# Patient Record
Sex: Male | Born: 1960 | Race: White | Hispanic: No | Smoking: Never smoker
Health system: Southern US, Community
[De-identification: ages and names within clinical notes are randomized; demographics above are authoritative.]

## PROBLEM LIST (undated history)

## (undated) DIAGNOSIS — R0602 Shortness of breath: Secondary | ICD-10-CM

## (undated) DIAGNOSIS — F32A Depression, unspecified: Secondary | ICD-10-CM

## (undated) DIAGNOSIS — R519 Headache, unspecified: Secondary | ICD-10-CM

## (undated) DIAGNOSIS — J302 Other seasonal allergic rhinitis: Secondary | ICD-10-CM

## (undated) DIAGNOSIS — Z87442 Personal history of urinary calculi: Secondary | ICD-10-CM

## (undated) DIAGNOSIS — J45909 Unspecified asthma, uncomplicated: Secondary | ICD-10-CM

## (undated) DIAGNOSIS — M545 Low back pain, unspecified: Secondary | ICD-10-CM

## (undated) DIAGNOSIS — K219 Gastro-esophageal reflux disease without esophagitis: Secondary | ICD-10-CM

## (undated) DIAGNOSIS — F439 Reaction to severe stress, unspecified: Secondary | ICD-10-CM

## (undated) DIAGNOSIS — I4430 Unspecified atrioventricular block: Secondary | ICD-10-CM

## (undated) DIAGNOSIS — G8929 Other chronic pain: Secondary | ICD-10-CM

## (undated) DIAGNOSIS — Z950 Presence of cardiac pacemaker: Secondary | ICD-10-CM

## (undated) HISTORY — DX: Shortness of breath: R06.02

## (undated) HISTORY — PX: NO PAST SURGERIES: SHX2092

---

## 2012-10-28 DIAGNOSIS — I447 Left bundle-branch block, unspecified: Secondary | ICD-10-CM

## 2012-10-28 HISTORY — DX: Left bundle-branch block, unspecified: I44.7

## 2012-10-29 DIAGNOSIS — E785 Hyperlipidemia, unspecified: Secondary | ICD-10-CM | POA: Insufficient documentation

## 2012-10-29 HISTORY — DX: Hyperlipidemia, unspecified: E78.5

## 2012-12-16 LAB — HM COLONOSCOPY

## 2014-01-22 DIAGNOSIS — F439 Reaction to severe stress, unspecified: Secondary | ICD-10-CM | POA: Insufficient documentation

## 2014-01-22 DIAGNOSIS — E669 Obesity, unspecified: Secondary | ICD-10-CM

## 2014-01-22 HISTORY — DX: Obesity, unspecified: E66.9

## 2016-12-21 DIAGNOSIS — M5116 Intervertebral disc disorders with radiculopathy, lumbar region: Secondary | ICD-10-CM

## 2016-12-21 HISTORY — DX: Intervertebral disc disorders with radiculopathy, lumbar region: M51.16

## 2018-03-02 ENCOUNTER — Emergency Department: Payer: PRIVATE HEALTH INSURANCE

## 2018-03-02 ENCOUNTER — Encounter: Payer: Self-pay | Admitting: Emergency Medicine

## 2018-03-02 ENCOUNTER — Emergency Department
Admission: EM | Admit: 2018-03-02 | Discharge: 2018-03-02 | Disposition: A | Discharge status: Home / Self Care | Payer: PRIVATE HEALTH INSURANCE | Attending: Emergency Medicine | Admitting: Emergency Medicine

## 2018-03-02 DIAGNOSIS — S161XXA Strain of muscle, fascia and tendon at neck level, initial encounter: Secondary | ICD-10-CM | POA: Diagnosis not present

## 2018-03-02 DIAGNOSIS — Y9389 Activity, other specified: Secondary | ICD-10-CM | POA: Diagnosis not present

## 2018-03-02 DIAGNOSIS — Y92410 Unspecified street and highway as the place of occurrence of the external cause: Secondary | ICD-10-CM | POA: Diagnosis not present

## 2018-03-02 DIAGNOSIS — Y999 Unspecified external cause status: Secondary | ICD-10-CM | POA: Insufficient documentation

## 2018-03-02 DIAGNOSIS — J45909 Unspecified asthma, uncomplicated: Secondary | ICD-10-CM | POA: Diagnosis not present

## 2018-03-02 DIAGNOSIS — S199XXA Unspecified injury of neck, initial encounter: Secondary | ICD-10-CM | POA: Diagnosis present

## 2018-03-02 HISTORY — DX: Unspecified asthma, uncomplicated: J45.909

## 2018-03-02 NOTE — ED Notes (Signed)
ED Provider at bedside. 
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
## 2018-08-14 ENCOUNTER — Inpatient Hospital Stay (HOSPITAL_COMMUNITY)
Admission: EM | Admit: 2018-08-14 | Discharge: 2018-08-16 | DRG: 243 | Disposition: A | Discharge status: Home / Self Care | Payer: PRIVATE HEALTH INSURANCE | Attending: Cardiovascular Disease | Admitting: Cardiovascular Disease

## 2018-08-14 ENCOUNTER — Other Ambulatory Visit: Payer: Self-pay

## 2018-08-14 ENCOUNTER — Encounter (HOSPITAL_COMMUNITY): Payer: Self-pay

## 2018-08-14 ENCOUNTER — Emergency Department (HOSPITAL_COMMUNITY): Payer: PRIVATE HEALTH INSURANCE

## 2018-08-14 DIAGNOSIS — Z79899 Other long term (current) drug therapy: Secondary | ICD-10-CM | POA: Diagnosis not present

## 2018-08-14 DIAGNOSIS — Z91013 Allergy to seafood: Secondary | ICD-10-CM | POA: Diagnosis not present

## 2018-08-14 DIAGNOSIS — I200 Unstable angina: Secondary | ICD-10-CM | POA: Diagnosis present

## 2018-08-14 DIAGNOSIS — R9431 Abnormal electrocardiogram [ECG] [EKG]: Secondary | ICD-10-CM

## 2018-08-14 DIAGNOSIS — Z87442 Personal history of urinary calculi: Secondary | ICD-10-CM

## 2018-08-14 DIAGNOSIS — J45909 Unspecified asthma, uncomplicated: Secondary | ICD-10-CM | POA: Diagnosis present

## 2018-08-14 DIAGNOSIS — K219 Gastro-esophageal reflux disease without esophagitis: Secondary | ICD-10-CM | POA: Diagnosis present

## 2018-08-14 DIAGNOSIS — I441 Atrioventricular block, second degree: Secondary | ICD-10-CM | POA: Diagnosis present

## 2018-08-14 DIAGNOSIS — I442 Atrioventricular block, complete: Secondary | ICD-10-CM | POA: Diagnosis not present

## 2018-08-14 DIAGNOSIS — Z959 Presence of cardiac and vascular implant and graft, unspecified: Secondary | ICD-10-CM

## 2018-08-14 DIAGNOSIS — Z7982 Long term (current) use of aspirin: Secondary | ICD-10-CM | POA: Diagnosis not present

## 2018-08-14 DIAGNOSIS — I4430 Unspecified atrioventricular block: Secondary | ICD-10-CM | POA: Diagnosis present

## 2018-08-14 DIAGNOSIS — I10 Essential (primary) hypertension: Secondary | ICD-10-CM | POA: Diagnosis present

## 2018-08-14 DIAGNOSIS — I459 Conduction disorder, unspecified: Secondary | ICD-10-CM

## 2018-08-14 DIAGNOSIS — R0602 Shortness of breath: Secondary | ICD-10-CM

## 2018-08-14 HISTORY — DX: Low back pain, unspecified: M54.50

## 2018-08-14 HISTORY — DX: Low back pain: M54.5

## 2018-08-14 HISTORY — DX: Unspecified atrioventricular block: I44.30

## 2018-08-14 HISTORY — DX: Gastro-esophageal reflux disease without esophagitis: K21.9

## 2018-08-14 HISTORY — DX: Other chronic pain: G89.29

## 2018-08-14 HISTORY — DX: Reaction to severe stress, unspecified: F43.9

## 2018-08-14 HISTORY — DX: Personal history of urinary calculi: Z87.442

## 2018-08-14 HISTORY — DX: Abnormal electrocardiogram (ECG) (EKG): R94.31

## 2018-08-14 LAB — CBC
HEMATOCRIT: 43.3 % (ref 39.0–52.0)
HEMATOCRIT: 42.1 % (ref 39.0–52.0)
HEMOGLOBIN: 13.6 g/dL (ref 13.0–17.0)
Hemoglobin: 14 g/dL (ref 13.0–17.0)
MCH: 28.1 pg (ref 26.0–34.0)
MCH: 27.9 pg (ref 26.0–34.0)
MCHC: 32.3 g/dL (ref 30.0–36.0)
MCHC: 32.3 g/dL (ref 30.0–36.0)
MCV: 86.9 fL (ref 80.0–100.0)
MCV: 86.3 fL (ref 80.0–100.0)
NRBC: 0 % (ref 0.0–0.2)
Platelets: 259 10*3/uL (ref 150–400)
Platelets: 228 10*3/uL (ref 150–400)
RBC: 4.98 MIL/uL (ref 4.22–5.81)
RBC: 4.88 MIL/uL (ref 4.22–5.81)
RDW: 12.6 % (ref 11.5–15.5)
RDW: 12.8 % (ref 11.5–15.5)
WBC: 9.5 10*3/uL (ref 4.0–10.5)
WBC: 9.3 10*3/uL (ref 4.0–10.5)
nRBC: 0 % (ref 0.0–0.2)

## 2018-08-14 LAB — BRAIN NATRIURETIC PEPTIDE: B Natriuretic Peptide: 59.4 pg/mL (ref 0.0–100.0)

## 2018-08-14 LAB — BASIC METABOLIC PANEL
ANION GAP: 9 (ref 5–15)
BUN: 16 mg/dL (ref 6–20)
CHLORIDE: 106 mmol/L (ref 98–111)
CO2: 25 mmol/L (ref 22–32)
Calcium: 9.5 mg/dL (ref 8.9–10.3)
Creatinine, Ser: 1.45 mg/dL — ABNORMAL HIGH (ref 0.61–1.24)
GFR calc Af Amer: >60 mL/min (ref >60)
GFR, EST NON AFRICAN AMERICAN: 52 mL/min — AB (ref >60)
GLUCOSE: 95 mg/dL (ref 70–99)
POTASSIUM: 4.6 mmol/L (ref 3.5–5.1)
SODIUM: 140 mmol/L (ref 135–145)

## 2018-08-14 LAB — I-STAT TROPONIN, ED: TROPONIN I, POC: 0.01 ng/mL (ref 0.00–0.08)

## 2018-08-14 MED ORDER — SODIUM CHLORIDE 0.9 % IV SOLN: 250.0000 mL | INTRAVENOUS | Status: DC | PRN

## 2018-08-14 MED ORDER — ASPIRIN EC 81 MG PO TBEC
81.0000 mg | DELAYED_RELEASE_TABLET | Freq: Every day | ORAL | Status: DC
  Administered 2018-08-15: 08:00:00 81 mg via ORAL
  Filled 2018-08-14: qty 1

## 2018-08-14 MED ORDER — ACETAMINOPHEN 325 MG PO TABS: 650.0000 mg | ORAL_TABLET | ORAL | Status: DC | PRN

## 2018-08-14 MED ORDER — SODIUM CHLORIDE 0.9% FLUSH
3.0000 mL | Freq: Two times a day (BID) | INTRAVENOUS | Status: DC
  Administered 2018-08-14 – 2018-08-15 (×2): 3 mL via INTRAVENOUS

## 2018-08-14 MED ORDER — ONDANSETRON HCL 4 MG/2ML IJ SOLN: 4.0000 mg | Freq: Four times a day (QID) | INTRAMUSCULAR | Status: DC | PRN

## 2018-08-14 MED ORDER — ASPIRIN 81 MG PO CHEW
81.0000 mg | CHEWABLE_TABLET | ORAL | Status: AC
  Administered 2018-08-15: 06:00:00 81 mg via ORAL
  Filled 2018-08-14: qty 1

## 2018-08-14 MED ORDER — ASPIRIN 81 MG PO CHEW
324.0000 mg | CHEWABLE_TABLET | Freq: Once | ORAL | Status: AC
  Administered 2018-08-14: 324 mg via ORAL
  Filled 2018-08-14: qty 4

## 2018-08-14 MED ORDER — SODIUM CHLORIDE 0.9 % WEIGHT BASED INFUSION
3.0000 mL/kg/h | INTRAVENOUS | Status: DC
  Administered 2018-08-15: 04:00:00 3 mL/kg/h via INTRAVENOUS

## 2018-08-14 MED ORDER — SODIUM CHLORIDE 0.9 % WEIGHT BASED INFUSION: 1.0000 mL/kg/h | INTRAVENOUS | Status: DC

## 2018-08-14 MED ORDER — SODIUM CHLORIDE 0.9 % IV SOLN
INTRAVENOUS | Status: DC
  Administered 2018-08-14 – 2018-08-15 (×2): via INTRAVENOUS

## 2018-08-14 MED ORDER — SODIUM CHLORIDE 0.9% FLUSH: 3.0000 mL | INTRAVENOUS | Status: DC | PRN

## 2018-08-14 MED ORDER — HEPARIN SODIUM (PORCINE) 5000 UNIT/ML IJ SOLN
5000.0000 [IU] | Freq: Three times a day (TID) | INTRAMUSCULAR | Status: DC
  Administered 2018-08-14 – 2018-08-15 (×2): 5000 [IU] via SUBCUTANEOUS
  Filled 2018-08-14 (×2): qty 1

## 2018-08-14 NOTE — ED Notes (Signed)
Pt ambulated to the restroom without difficulty. Gait steady and even. Denies feeling lightheaded/dizzy.

## 2018-08-14 NOTE — ED Provider Notes (Signed)
[facility] EMERGENCY DEPARTMENT Provider Note   CSN: [id number] Arrival date & time: 08/14/18  1638     History   Chief Complaint Chief Complaint  Patient presents with  . Dizziness  . Bradycardia    HPI Brandon Ramirez is a 57 y.o. male.  HPI Pt has been having episodes of lightheadedness.  One occurred while skating with his son but the symptoms resolved.  These have been going on and off, no syncope.  Not always with exertion.   He has been trouble with intermittent shortness of breath with exertion as well although  not consistently.  He was able to bike ride as well as doing some heavy activity in the yard without trouble.   Pt has a trip planned for tomorrow.  He went to an urgent care for evaluation prior to the trip and they sent him to the ED for evaluation.  He was told he had a heart block and was instructed to come to the ED Past Medical History:  Diagnosis Date  . Asthma     Patient Active Problem List   Diagnosis Date Noted  . Abnormal EKG 08/14/2018    History reviewed. No pertinent surgical history.      Home Medications    Prior to Admission medications   Not on File    Family History History reviewed. No pertinent family history.  Social History Social History   Tobacco Use  . Smoking status: Never Smoker  . Smokeless tobacco: Never Used  Substance Use Topics  . Alcohol use: Not on file  . Drug use: Not on file     Allergies   Patient has no known allergies.   Review of Systems Review of Systems  Constitutional: Negative for fever.  Respiratory: Negative for shortness of breath.   Cardiovascular: Negative for chest pain.  All other systems reviewed and are negative.    Physical Exam Updated Vital Signs BP (!) 141/82   Pulse 63   Resp 11   SpO2 98%   Physical Exam  Constitutional: He appears well-developed and well-nourished. No distress.  HENT:  Head: Normocephalic and atraumatic.  Right Ear:  External ear normal.  Left Ear: External ear normal.  Eyes: Conjunctivae are normal. Right eye exhibits no discharge. Left eye exhibits no discharge. No scleral icterus.  Neck: Neck supple. No tracheal deviation present.  Cardiovascular: Intact distal pulses. An irregular rhythm present. Bradycardia present.  Pulmonary/Chest: Effort normal and breath sounds normal. No stridor. No respiratory distress. He has no wheezes. He has no rales.  Abdominal: Soft. Bowel sounds are normal. He exhibits no distension. There is no tenderness. There is no rebound and no guarding.  Musculoskeletal: He exhibits no edema or tenderness.  Neurological: He is alert. He has normal strength. No cranial nerve deficit (no facial droop, extraocular movements intact, no slurred speech) or sensory deficit. He exhibits normal muscle tone. He displays no seizure activity. Coordination normal.  Skin: Skin is warm and dry. No rash noted.  Psychiatric: He has a normal mood and affect.  Nursing note and vitals reviewed.    ED Treatments / Results  Labs (all labs ordered are listed, but only abnormal results are displayed) Labs Reviewed  CBC  BASIC METABOLIC PANEL  BRAIN NATRIURETIC PEPTIDE  I-STAT TROPONIN, ED    EKG EKG Interpretation  Date/Time:  Thursday August 14 2018 16:57:47 EDT Ventricular Rate:  40 PR Interval:    QRS Duration: 124 QT Interval:  446 QTC Calculation:  345 R Axis:   87 Text Interpretation:  Sinus rhythm with second degree AV block IVCD, consider atypical RBBB No old tracing to compare Confirmed by Linwood Dibbles [phone]) on 08/14/2018 5:05:56 PM   Radiology Dg Chest Portable 1 View  Result Date: 08/14/2018 CLINICAL DATA:  Shortness of breath and bradycardia. EXAM: PORTABLE CHEST 1 VIEW COMPARISON:  None. FINDINGS: Artifact overlies the chest. Heart and mediastinal shadows are normal. Lungs are clear. The vascularity is normal. No effusions. IMPRESSION: No active disease. Electronically  Signed   By: Paulina Fusi M.D.   On: 08/14/2018 17:36    Procedures .Critical Care Performed by: Linwood Dibbles, MD Authorized by: Linwood Dibbles, MD   Critical care provider statement:    Critical care time (minutes):  30   Critical care was time spent personally by me on the following activities:  Discussions with consultants, evaluation of patient's response to treatment, examination of patient, ordering and performing treatments and interventions, ordering and review of laboratory studies, ordering and review of radiographic studies, pulse oximetry, re-evaluation of patient's condition, obtaining history from patient or surrogate and review of old charts   (including critical care time)  Medications Ordered in ED Medications  0.9 %  sodium chloride infusion ( Intravenous New Bag/Given 08/14/18 1718)  aspirin chewable tablet 324 mg (324 mg Oral Given 08/14/18 1718)     Initial Impression / Assessment and Plan / ED Course  I have reviewed the triage vital signs and the nursing notes.  Pertinent labs & imaging results that were available during my care of the patient were reviewed by me and considered in my medical decision making (see chart for details).  Clinical Course as of Aug 15 1835  Thu Aug 14, 2018  1710 EKG at the urgent care uploaded to media file   [JK]    Clinical Course User Index [JK] Linwood Dibbles, MD    Patient presented to the emergency room for further evaluation of newly diagnosed heart block.  Patient has been symptomatic.  He has had episodes of lightheadedness and decreased exercise tolerance.  I consulted with cardiology.  Dr. Elease Hashimoto evaluated the patient in the ED.  Patient is currently stable at this point but plan is admission to the hospital for probable pacemaker placement and further evaluation  Final Clinical Impressions(s) / ED Diagnoses   Final diagnoses:  Heart block atrioventricular      Linwood Dibbles, MD 08/14/18 [phone]

## 2018-08-14 NOTE — ED Triage Notes (Signed)
Pt arrived via [facility] from urgent care c/o SOB and dizziness over the last couple weeks.  EMS reports type 2 2nd block per their EKG.  Heart rate with them fluctuates between 40-60bpm.  Denies and chest pain, no meds given.

## 2018-08-14 NOTE — H&P (Addendum)
Cardiology History and Physical:   Patient ID: Brandon Ramirez MRN: [id number]; DOB: [birth year]  Admit date: 08/14/2018 Date of Consult: 08/14/2018  Primary Care Provider: Patient, No Pcp Per Primary Cardiologist: Kristeen Miss, MD  Primary Electrophysiologist:  None    Patient Profile:   Brandon Ramirez is a 57 y.o. male with a hx of asthma who is being seen today for the evaluation of abnormal EKG at the request of Dr. Lynelle Doctor.  History of Present Illness:   Brandon Ramirez has been experiencing intermittent SOB and dizziness over the past 2.5 weeks. He is planning to go on vacation and decided to go to urgent care to be evaluated. At UC, EKG was obtained and was suspicious for possible heart block. He was advised to present to the ER. On arrival, pressure was 180/152. EKG with possible 2:1 heart block. Cardiology was consulted.  On my interview, 2.5 weeks ago while ice skating with son, felt weak. He rested and felt better.  Since then, he had SOB and weakness with minimal walking (20 ft or walking up stairs). Commutes to [place] for work. He is active, biking, exercise, etc. He denies CP. Denies tick bites. He takes no medications and no drug allergies. No bleeding problems, fevers, or recent illness.  In ER, pressure is mildly elevated 154/93. He is normally 120s systolic.   Family history - Dad had a PPM (49 yo), Mom PPM (90s).    Past Medical History:  Diagnosis Date  . Asthma     History reviewed. No pertinent surgical history.   Home Medications:  Prior to Admission medications   Not on File    Inpatient Medications: Scheduled Meds:  Continuous Infusions: . sodium chloride 125 mL/hr at 08/14/18 1718   PRN Meds:   Allergies:   No Known Allergies  Social History:   Social History   Socioeconomic History  . Marital status: Single    Spouse name: Not on file  . Number of children: Not on file  . Years of education: Not on file  . Highest education level: Not on  file  Occupational History  . Not on file  Social Needs  . Financial resource strain: Not on file  . Food insecurity:    Worry: Not on file    Inability: Not on file  . Transportation needs:    Medical: Not on file    Non-medical: Not on file  Tobacco Use  . Smoking status: Never Smoker  . Smokeless tobacco: Never Used  Substance and Sexual Activity  . Alcohol use: Not on file  . Drug use: Not on file  . Sexual activity: Not on file  Lifestyle  . Physical activity:    Days per week: Not on file    Minutes per session: Not on file  . Stress: Not on file  Relationships  . Social connections:    Talks on phone: Not on file    Gets together: Not on file    Attends religious service: Not on file    Active member of club or organization: Not on file    Attends meetings of clubs or organizations: Not on file    Relationship status: Not on file  . Intimate partner violence:    Fear of current or ex partner: Not on file    Emotionally abused: Not on file    Physically abused: Not on file    Forced sexual activity: Not on file  Other Topics Concern  . Not on file  Social History Narrative  . Not on file    Family History:   History reviewed. No pertinent family history.   ROS:  Please see the history of present illness.   All other ROS reviewed and negative.     Physical Exam/Data:   Vitals:   08/14/18 1644 08/14/18 1650 08/14/18 1700 08/14/18 1715  BP:  (!) 180/152 128/71 (!) 141/82  Pulse:  62 69 63  Resp:  18 19 11   SpO2: 97% 100% 98% 98%   No intake or output data in the 24 hours ending 08/14/18 1830 There were no vitals filed for this visit. There is no height or weight on file to calculate BMI.  General:  Well nourished, well developed, in no acute distress HEENT: normal Neck: no JVD Vascular: No carotid bruits Cardiac:  Irregular rhythm, regular rate, no murmur Lungs:  clear to auscultation bilaterally, no wheezing, rhonchi or rales  Abd: soft,  nontender, no hepatomegaly  Ext: no edema Musculoskeletal:  No deformities, BUE and BLE strength normal and equal Skin: warm and dry  Neuro:  CNs 2-12 intact, no focal abnormalities noted Psych:  Normal affect   EKG:  The EKG was personally reviewed and demonstrates:  Intermittent 2:1 heart block Telemetry:  Telemetry was personally reviewed and demonstrates:  2:1 heart block, intermittent sinus  Relevant CV Studies:  Echo pending  Laboratory Data:  ChemistryNo results for input(s): NA, K, CL, CO2, GLUCOSE, BUN, CREATININE, CALCIUM, GFRNONAA, GFRAA, ANIONGAP in the last 168 hours.  No results for input(s): PROT, ALBUMIN, AST, ALT, ALKPHOS, BILITOT in the last 168 hours. Hematology Recent Labs  Lab 08/14/18 1654  WBC 9.5  RBC 4.98  HGB 14.0  HCT 43.3  MCV 86.9  MCH 28.1  MCHC 32.3  RDW 12.6  PLT 259   Cardiac EnzymesNo results for input(s): TROPONINI in the last 168 hours.  Recent Labs  Lab 08/14/18 1734  TROPIPOC 0.01    BNPNo results for input(s): BNP, PROBNP in the last 168 hours.  DDimer No results for input(s): DDIMER in the last 168 hours.  Radiology/Studies:  Dg Chest Portable 1 View  Result Date: 08/14/2018 CLINICAL DATA:  Shortness of breath and bradycardia. EXAM: PORTABLE CHEST 1 VIEW COMPARISON:  None. FINDINGS: Artifact overlies the chest. Heart and mediastinal shadows are normal. Lungs are clear. The vascularity is normal. No effusions. IMPRESSION: No active disease. Electronically Signed   By: Paulina Fusi M.D.   On: 08/14/2018 17:36    Assessment and Plan:   1. Abnormal EKG - step-test performed in room - heart rate increased, but continued to have 2:1 heart block - consulted with EP - plan for heart cath tomorrow morning to rule out RCA disease - echo pending - TSH, electrolytes, trend troponin  2. EP consult tomorrow for possible pacemaker insertion tomorrow afternoon  3. Hypertension - monitor overnight      For questions or updates,  please contact [facility] Please consult [email or web address] for contact info under     Signed, Marcelino Duster, [place]  08/14/2018 6:30 PM   Attending Note:   The patient was seen and examined.  Agree with assessment and plan as noted above.  Changes made to the above note as needed.  Patient seen and independently examined with Bettina Gavia, PA .   We discussed all aspects of the encounter. I agree with the assessment and plan as stated above.  1.  Second-degree heart block: The patient presents with symptoms of lightheadedness, generalized weakness,  shortness of breath with exertion. He has second-degree heart block type II. We performed a 1 minute step test in while his heart rate increased slightly from the 40s up to 70, he continued to have second-degree heart block.  On rare occasion he would conduct 1-1 but this would be for only 3-4 heartbeats.  He is clearly symptomatic from this.  2 weeks ago he could do fairly vigorous activity.  Starting 2 Sundays ago, he started having severe shortness of breath and generalized fatigue and dizziness. He denies having any syncope.  He denies any tick bites or Lyme disease symptoms. Denies any angina or angina symptoms.  He denies any heat or cold intolerance.  He denies any nausea vomiting or diarrhea.  Is not been taking any new medications. Denies any blood in his urine or blood in his stool. His father had a pacemaker at age 43.  His mother had a pacemaker in her 90s.  Discussed the case with Dr. Johney Frame.  We will plan on doing a diagnostic heart catheterization tomorrow to make sure that he does not have significant coronary artery disease.    We will anticipate placing a  pacemaker later in the afternoon.  2.  HTN:  BP is a bit elevated in the emergency room.  It may be because he is anxious.  We will continue to monitor his blood pressure.  Check screening lipids tomorrow.   I have spent a total of 40 minutes with patient reviewing  hospital  notes , telemetry, EKGs, labs and examining patient as well as establishing an assessment and plan that was discussed with the patient. > 50% of time was spent in direct patient care.    Vesta Mixer, Montez Hageman., MD, [facility] 08/14/2018, 6:46 PM [address],  [address] Office [phone] Pager [phone]

## 2018-08-15 ENCOUNTER — Encounter (HOSPITAL_COMMUNITY)
Admission: EM | Disposition: A | Discharge status: Home / Self Care | Payer: Self-pay | Source: Home / Self Care | Attending: Cardiovascular Disease

## 2018-08-15 ENCOUNTER — Encounter (HOSPITAL_COMMUNITY): Payer: Self-pay | Admitting: Internal Medicine

## 2018-08-15 ENCOUNTER — Inpatient Hospital Stay (HOSPITAL_COMMUNITY): Payer: PRIVATE HEALTH INSURANCE

## 2018-08-15 DIAGNOSIS — I441 Atrioventricular block, second degree: Secondary | ICD-10-CM

## 2018-08-15 DIAGNOSIS — R0602 Shortness of breath: Secondary | ICD-10-CM

## 2018-08-15 DIAGNOSIS — I442 Atrioventricular block, complete: Secondary | ICD-10-CM

## 2018-08-15 DIAGNOSIS — I4430 Unspecified atrioventricular block: Secondary | ICD-10-CM

## 2018-08-15 HISTORY — PX: LEFT HEART CATH AND CORONARY ANGIOGRAPHY: CATH118249

## 2018-08-15 HISTORY — PX: PACEMAKER IMPLANT: EP1218

## 2018-08-15 LAB — CREATININE, SERUM
Creatinine, Ser: 1.52 mg/dL — ABNORMAL HIGH (ref 0.61–1.24)
GFR calc Af Amer: 57 mL/min — ABNORMAL LOW (ref >60)
GFR, EST NON AFRICAN AMERICAN: 50 mL/min — AB (ref >60)

## 2018-08-15 LAB — LIPID PANEL
Cholesterol: 177 mg/dL (ref 0–200)
HDL: 30 mg/dL — AB (ref >40)
LDL CALC: 93 mg/dL (ref 0–99)
TRIGLYCERIDES: 272 mg/dL — AB (ref <150)
Total CHOL/HDL Ratio: 5.9 RATIO
VLDL: 54 mg/dL — ABNORMAL HIGH (ref 0–40)

## 2018-08-15 LAB — BASIC METABOLIC PANEL
ANION GAP: 8 (ref 5–15)
BUN: 16 mg/dL (ref 6–20)
CHLORIDE: 108 mmol/L (ref 98–111)
CO2: 22 mmol/L (ref 22–32)
Calcium: 8.6 mg/dL — ABNORMAL LOW (ref 8.9–10.3)
Creatinine, Ser: 1.37 mg/dL — ABNORMAL HIGH (ref 0.61–1.24)
GFR calc non Af Amer: 56 mL/min — ABNORMAL LOW (ref >60)
Glucose, Bld: 96 mg/dL (ref 70–99)
Potassium: 4 mmol/L (ref 3.5–5.1)
SODIUM: 138 mmol/L (ref 135–145)

## 2018-08-15 LAB — CBC
HEMATOCRIT: 38.8 % — AB (ref 39.0–52.0)
Hemoglobin: 13.2 g/dL (ref 13.0–17.0)
MCH: 29.4 pg (ref 26.0–34.0)
MCHC: 34 g/dL (ref 30.0–36.0)
MCV: 86.4 fL (ref 80.0–100.0)
NRBC: 0 % (ref 0.0–0.2)
PLATELETS: 205 10*3/uL (ref 150–400)
RBC: 4.49 MIL/uL (ref 4.22–5.81)
RDW: 12.7 % (ref 11.5–15.5)
WBC: 8.3 10*3/uL (ref 4.0–10.5)

## 2018-08-15 LAB — SURGICAL PCR SCREEN
MRSA, PCR: NEGATIVE
Staphylococcus aureus: POSITIVE — AB

## 2018-08-15 LAB — TROPONIN I
Troponin I: <0.03 ng/mL (ref <0.03)
Troponin I: <0.03 ng/mL (ref <0.03)

## 2018-08-15 LAB — TSH: TSH: 2.71 u[IU]/mL (ref 0.350–4.500)

## 2018-08-15 LAB — MAGNESIUM: Magnesium: 1.7 mg/dL (ref 1.7–2.4)

## 2018-08-15 LAB — ECHOCARDIOGRAM COMPLETE
Height: 69 in
Weight: 3199.32 oz

## 2018-08-15 LAB — HIV ANTIBODY (ROUTINE TESTING W REFLEX): HIV SCREEN 4TH GENERATION: NONREACTIVE

## 2018-08-15 SURGERY — PACEMAKER IMPLANT

## 2018-08-15 SURGERY — LEFT HEART CATH AND CORONARY ANGIOGRAPHY
Anesthesia: LOCAL

## 2018-08-15 MED ORDER — MIDAZOLAM HCL 5 MG/5ML IJ SOLN
INTRAMUSCULAR | Status: AC
  Filled 2018-08-15: qty 5

## 2018-08-15 MED ORDER — MIDAZOLAM HCL 2 MG/2ML IJ SOLN
INTRAMUSCULAR | Status: AC
  Filled 2018-08-15: qty 2

## 2018-08-15 MED ORDER — ACETAMINOPHEN 325 MG PO TABS
325.0000 mg | ORAL_TABLET | ORAL | Status: DC | PRN
  Administered 2018-08-15 (×2): 650 mg via ORAL
  Filled 2018-08-15 (×2): qty 2

## 2018-08-15 MED ORDER — VERAPAMIL HCL 2.5 MG/ML IV SOLN
INTRAVENOUS | Status: AC
  Filled 2018-08-15: qty 2

## 2018-08-15 MED ORDER — LIDOCAINE HCL (PF) 1 % IJ SOLN
INTRAMUSCULAR | Status: AC
  Filled 2018-08-15: qty 60

## 2018-08-15 MED ORDER — CHLORHEXIDINE GLUCONATE 4 % EX LIQD
60.0000 mL | Freq: Once | CUTANEOUS | Status: AC
  Administered 2018-08-15: 4 via TOPICAL
  Filled 2018-08-15: qty 15

## 2018-08-15 MED ORDER — HEPARIN SODIUM (PORCINE) 1000 UNIT/ML IJ SOLN
INTRAMUSCULAR | Status: AC
  Filled 2018-08-15: qty 1

## 2018-08-15 MED ORDER — SODIUM CHLORIDE 0.9 % IV SOLN
80.0000 mg | INTRAVENOUS | Status: AC
  Administered 2018-08-15: 80 mg
  Filled 2018-08-15: qty 2

## 2018-08-15 MED ORDER — HEPARIN (PORCINE) IN NACL 1000-0.9 UT/500ML-% IV SOLN
INTRAVENOUS | Status: AC
  Filled 2018-08-15: qty 1000

## 2018-08-15 MED ORDER — HEPARIN (PORCINE) IN NACL 1000-0.9 UT/500ML-% IV SOLN
INTRAVENOUS | Status: DC | PRN
  Administered 2018-08-15: 500 mL

## 2018-08-15 MED ORDER — CEFAZOLIN SODIUM-DEXTROSE 2-4 GM/100ML-% IV SOLN
INTRAVENOUS | Status: AC
  Filled 2018-08-15: qty 100

## 2018-08-15 MED ORDER — LIDOCAINE HCL (PF) 1 % IJ SOLN
INTRAMUSCULAR | Status: AC
  Filled 2018-08-15: qty 30

## 2018-08-15 MED ORDER — IOHEXOL 350 MG/ML SOLN
INTRAVENOUS | Status: DC | PRN
  Administered 2018-08-15: 40 mL via INTRA_ARTERIAL

## 2018-08-15 MED ORDER — FENTANYL CITRATE (PF) 100 MCG/2ML IJ SOLN
INTRAMUSCULAR | Status: AC
  Filled 2018-08-15: qty 2

## 2018-08-15 MED ORDER — HEPARIN (PORCINE) IN NACL 1000-0.9 UT/500ML-% IV SOLN
INTRAVENOUS | Status: AC
  Filled 2018-08-15: qty 500

## 2018-08-15 MED ORDER — SODIUM CHLORIDE 0.9 % IV SOLN: INTRAVENOUS | Status: DC

## 2018-08-15 MED ORDER — HYDROCODONE-ACETAMINOPHEN 5-325 MG PO TABS: 1.0000 | ORAL_TABLET | ORAL | Status: DC | PRN

## 2018-08-15 MED ORDER — LIDOCAINE HCL (PF) 1 % IJ SOLN
INTRAMUSCULAR | Status: DC | PRN
  Administered 2018-08-15: 60 mL

## 2018-08-15 MED ORDER — IOPAMIDOL (ISOVUE-370) INJECTION 76%
INTRAVENOUS | Status: DC | PRN
  Administered 2018-08-15: 15 mL via INTRAVENOUS

## 2018-08-15 MED ORDER — NITROGLYCERIN 1 MG/10 ML FOR IR/CATH LAB
INTRA_ARTERIAL | Status: AC
  Filled 2018-08-15: qty 10

## 2018-08-15 MED ORDER — HEPARIN SODIUM (PORCINE) 1000 UNIT/ML IJ SOLN
INTRAMUSCULAR | Status: DC | PRN
  Administered 2018-08-15: 4500 [IU] via INTRAVENOUS

## 2018-08-15 MED ORDER — SODIUM CHLORIDE 0.9% FLUSH: 3.0000 mL | INTRAVENOUS | Status: DC | PRN

## 2018-08-15 MED ORDER — CEFAZOLIN SODIUM-DEXTROSE 1-4 GM/50ML-% IV SOLN
1.0000 g | Freq: Four times a day (QID) | INTRAVENOUS | Status: AC
  Administered 2018-08-15 – 2018-08-16 (×3): 1 g via INTRAVENOUS
  Filled 2018-08-15 (×3): qty 50

## 2018-08-15 MED ORDER — YOU HAVE A PACEMAKER BOOK
Freq: Once | Status: AC
  Administered 2018-08-15: 22:00:00
  Filled 2018-08-15: qty 1

## 2018-08-15 MED ORDER — FENTANYL CITRATE (PF) 100 MCG/2ML IJ SOLN
INTRAMUSCULAR | Status: DC | PRN
  Administered 2018-08-15: 25 ug via INTRAVENOUS

## 2018-08-15 MED ORDER — SODIUM CHLORIDE 0.9 % IV SOLN
INTRAVENOUS | Status: AC
  Filled 2018-08-15: qty 2

## 2018-08-15 MED ORDER — HEPARIN (PORCINE) IN NACL 1000-0.9 UT/500ML-% IV SOLN
INTRAVENOUS | Status: DC | PRN
  Administered 2018-08-15 (×2): 500 mL

## 2018-08-15 MED ORDER — SODIUM CHLORIDE 0.9 % IV SOLN: INTRAVENOUS | Status: AC

## 2018-08-15 MED ORDER — CEFAZOLIN SODIUM-DEXTROSE 2-4 GM/100ML-% IV SOLN
2.0000 g | INTRAVENOUS | Status: AC
  Administered 2018-08-15: 2 g via INTRAVENOUS
  Filled 2018-08-15: qty 100

## 2018-08-15 MED ORDER — MIDAZOLAM HCL 2 MG/2ML IJ SOLN
INTRAMUSCULAR | Status: DC | PRN
  Administered 2018-08-15: 1 mg via INTRAVENOUS

## 2018-08-15 MED ORDER — SODIUM CHLORIDE 0.9 % IV SOLN: 250.0000 mL | INTRAVENOUS | Status: DC | PRN

## 2018-08-15 MED ORDER — FENTANYL CITRATE (PF) 100 MCG/2ML IJ SOLN
INTRAMUSCULAR | Status: DC | PRN
  Administered 2018-08-15: 12.5 ug via INTRAVENOUS

## 2018-08-15 MED ORDER — IOPAMIDOL (ISOVUE-370) INJECTION 76%
INTRAVENOUS | Status: AC
  Filled 2018-08-15: qty 50

## 2018-08-15 MED ORDER — LIDOCAINE HCL (PF) 1 % IJ SOLN
INTRAMUSCULAR | Status: DC | PRN
  Administered 2018-08-15: 2 mL

## 2018-08-15 MED ORDER — ONDANSETRON HCL 4 MG/2ML IJ SOLN: 4.0000 mg | Freq: Four times a day (QID) | INTRAMUSCULAR | Status: DC | PRN

## 2018-08-15 MED ORDER — OFF THE BEAT BOOK
Freq: Once | Status: AC
  Administered 2018-08-15: 22:00:00
  Filled 2018-08-15: qty 1

## 2018-08-15 MED ORDER — SODIUM CHLORIDE 0.9% FLUSH
3.0000 mL | Freq: Two times a day (BID) | INTRAVENOUS | Status: DC
  Administered 2018-08-15: 22:00:00 3 mL via INTRAVENOUS

## 2018-08-15 MED ORDER — NITROGLYCERIN 1 MG/10 ML FOR IR/CATH LAB
INTRA_ARTERIAL | Status: DC | PRN
  Administered 2018-08-15: 200 ug via INTRACORONARY

## 2018-08-15 MED ORDER — MIDAZOLAM HCL 5 MG/5ML IJ SOLN
INTRAMUSCULAR | Status: DC | PRN
  Administered 2018-08-15 (×2): 1 mg via INTRAVENOUS

## 2018-08-15 SURGICAL SUPPLY — 10 items
CATH IMPULSE 5F ANG/FL3.5 (CATHETERS) ×2 IMPLANT
DEVICE RAD COMP TR BAND LRG (VASCULAR PRODUCTS) ×2 IMPLANT
GLIDESHEATH SLEND SS 6F .021 (SHEATH) ×2 IMPLANT
GUIDEWIRE INQWIRE 1.5J.035X260 (WIRE) ×1 IMPLANT
INQWIRE 1.5J .035X260CM (WIRE) ×2
KIT HEART LEFT (KITS) ×2 IMPLANT
PACK CARDIAC CATHETERIZATION (CUSTOM PROCEDURE TRAY) ×2 IMPLANT
SHEATH PROBE COVER 6X72 (BAG) ×2 IMPLANT
TRANSDUCER W/STOPCOCK (MISCELLANEOUS) ×2 IMPLANT
TUBING CIL FLEX 10 FLL-RA (TUBING) ×2 IMPLANT

## 2018-08-15 SURGICAL SUPPLY — 12 items
CABLE SURGICAL S-101-97-12 (CABLE) ×3 IMPLANT
CATH RIGHTSITE C315HIS02 (CATHETERS) ×3 IMPLANT
IPG PACE AZUR XT DR MRI W1DR01 (Pacemaker) ×1 IMPLANT
LEAD CAPSURE NOVUS 5076-52CM (Lead) ×3 IMPLANT
LEAD SELECT SECURE 3830 383069 (Lead) ×1 IMPLANT
PACE AZURE XT DR MRI W1DR01 (Pacemaker) ×3 IMPLANT
PAD PRO RADIOLUCENT 2001M-C (PAD) ×3 IMPLANT
SELECT SECURE 3830 383069 (Lead) ×3 IMPLANT
SHEATH CLASSIC 7F (SHEATH) ×6 IMPLANT
SLITTER 6232ADJ (MISCELLANEOUS) ×3 IMPLANT
TRAY PACEMAKER INSERTION (PACKS) ×3 IMPLANT
WIRE HI TORQ VERSACORE-J 145CM (WIRE) ×3 IMPLANT

## 2018-08-15 NOTE — Discharge Summary (Addendum)
**Note Brandon-Identified via Obfuscation** ELECTROPHYSIOLOGY PROCEDURE DISCHARGE SUMMARY    Patient ID: Brandon Ramirez,  MRN: [id number], DOB/AGE: 12/02/1960 57 y.o.  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  Asthma  Allergies  Allergen Reactions  . Shellfish Allergy Swelling     Procedures This Admission:  1.  Implantation of a MDT dual chamber PPM on 08/15/18 by Dr Johney Frame.  The patient received a MDT model number Azure PPM with model number 5076 right atrial lead and 3830 right ventricular lead. There were no immediate post procedure complications. 2.  CXR on 08/16/18 demonstrated no pneumothorax status post device implantation.   Brief HPI/Hospital Course:  Brandon Ramirez is a 57 y.o. male with no significant past medical history.  He is very active typically riding bikes, ice skating, and routine exercise.  On 09/26/18 around 3PM, he was ice skating with his son and developed acute onset lightheadedness and shortness of breath. Since then, he has had persistent exercise intolerance and shortness of breath with exertion. He has not had frank syncope. He was seen by his PCP yesterday and his heart rate was in the 40's. He was sent to the ER for further evaluation.  He was found to be in 2:1 heart block with narrow QRS escape. He was seen by Dr Johney Frame who recommended pacemaker implantation. Catheterization demonstrated no CAD and normal LV function. Echo demonstrated EF 60-65%, no RWMA. The patient underwent implantation of a MDT dual chamber PPM with details as outlined above.  He  was monitored on telemetry overnight which demonstrated sinus with V pacing.  Left chest was without hematoma or ecchymosis.  The device was interrogated and found to be functioning normally.   CXR was obtained and demonstrated no pneumothorax status post device implantation.  Wound care, arm mobility, and restrictions were reviewed with the patient.  The patient was examined and considered stable for discharge to home.    Physical Exam: Vitals:   08/15/18 1959 08/16/18 0408 08/16/18 0417 08/16/18 0717  BP: (!) 175/62  128/70 129/75  Pulse:    67  Resp:    13  Temp: 98.2 F (36.8 C)   97.9 F (36.6 C)  TempSrc:    Oral  SpO2: 97%  95%   Weight:  96.6 kg    Height:        GEN- The patient is well appearing, alert and oriented x 3 today.   HEENT: normocephalic, atraumatic; sclera clear, conjunctiva pink; hearing intact; oropharynx clear; neck supple  Lungs- Clear to ausculation bilaterally, normal work of breathing.  No wheezes, rales, rhonchi Heart- Regular rate and rhythm (paced) GI- soft, non-tender, non-distended, bowel sounds present  Extremities- no clubbing, cyanosis, or edema  MS- no significant deformity or atrophy Skin- warm and dry, no rash or lesion, left chest without hematoma/ecchymosis Psych- euthymic mood, full affect Neuro- strength and sensation are intact   Labs:   Lab Results  Component Value Date   WBC 8.3 08/15/2018   HGB 13.2 08/15/2018   HCT 38.8 (L) 08/15/2018   MCV 86.4 08/15/2018   PLT 205 08/15/2018    Recent Labs  Lab 08/16/18 0238  NA 137  K 4.4  CL 105  CO2 23  BUN 15  CREATININE 1.37*  CALCIUM 8.9  GLUCOSE 113*    Discharge Medications:  Allergies as of 08/16/2018      Reactions   Shellfish Allergy Swelling      Medication List    STOP taking these medications   aspirin EC 81 MG tablet     TAKE these medications   acetaminophen 325 MG tablet Commonly known as:  TYLENOL Take 650 mg by mouth every 6 (six) hours as needed for mild pain.   cetirizine 10 MG tablet Commonly known as:  ZYRTEC Take 10 mg by mouth daily as needed for allergies.   Glucosamine-Chondroitin 250-200 MG Tabs Take 1 tablet by mouth  daily as needed.   ibuprofen 200 MG tablet Commonly known as:  ADVIL,MOTRIN Take 400 mg by mouth every 6 (six) hours as needed for mild pain.       Disposition:   Follow-up Information    [facility] Follow up on 08/27/2018.   Specialty:  Cardiology Why:  at [facility] information: [address] [place] [address] [phone]       [name], MD Follow up on 11/14/2018.   Specialty:  Cardiology Why:  at 11:45AM Contact information: [address] [place] [address] [phone]           Duration of Discharge Encounter: Greater than 30 minutes including physician time.  Randolm Idol MD 08/16/2018 9:53 AM

## 2018-08-15 NOTE — Plan of Care (Signed)

## 2018-08-15 NOTE — Consult Note (Signed)
 ELECTROPHYSIOLOGY CONSULT NOTE    Patient ID: Brandon Ramirez MRN: [id number], DOB/AGE: 03/18/1961 56 y.o.  Admit date: 08/14/2018 Date of Consult: 08/15/2018  Primary Physician: Patient, No Pcp Per Primary Cardiologist: Nahser Electrophysiologist: Eathon Valade (new this admission)  Patient Profile: Brandon Ramirez is a 56 y.o. male with a history of ashtma who is being seen today for the evaluation of heart block at the request of Dr Nahser.  HPI:  Brandon Ramirez is a 56 y.o. male with no significant past medical history.  He is very active typically riding bikes, ice skating, and routine exercise.  On 09/26/18 around 3PM, he was ice skating with his son and developed acute onset lightheadedness and shortness of breath. Since then, he has had persistent exercise intolerance and shortness of breath with exertion. He has not had frank syncope. He was seen by his PCP yesterday and his heart rate was in the 40's. He was sent to the ER for further evaluation.  He was found to be in 2:1 heart block with narrow QRS escape.  EP has been asked to evaluate for treatment options.  Catheterization this morning demonstrated normal LVEF and normal coronaries.  Echo is pending.    Both parents have pacemakers, dad at age 65, mom in the 90's  He denies chest pain, palpitations, PND, orthopnea, nausea, vomiting,  syncope, edema, weight gain, or early satiety.  Past Medical History:  Diagnosis Date  . Asthma   . Chronic lower back pain    "herniated disc between L3-4" (08/14/2018)  . GERD (gastroesophageal reflux disease)   . 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 History of kidney stones   . Stress at home    "related to custody" (08/14/2018)     Surgical History:  Past Surgical History:  Procedure Laterality Date  . NO PAST SURGERIES       Medications Prior to Admission  Medication Sig Dispense Refill Last Dose  . acetaminophen (TYLENOL) 325 MG tablet Take 650 mg by mouth every 6 (six) hours  as needed for mild pain.   unk at prn  . aspirin EC 81 MG tablet Take 81 mg by mouth every 4 (four) hours as needed for mild pain.   08/14/2018 at Unknown time  . cetirizine (ZYRTEC) 10 MG tablet Take 10 mg by mouth daily as needed for allergies.   unk at prn  . Glucosamine-Chondroitin 250-200 MG TABS Take 1 tablet by mouth daily as needed.   unk at prn  . ibuprofen (ADVIL,MOTRIN) 200 MG tablet Take 400 mg by mouth every 6 (six) hours as needed for mild pain.   08/14/2018 at Unknown time    Inpatient Medications:  . [MAR Hold] aspirin EC  81 mg Oral Daily  . gentamicin irrigation  80 mg Irrigation On Call  . [MAR Hold] heparin  5,000 Units Subcutaneous Q8H  . sodium chloride flush  3 mL Intravenous Q12H    Allergies:  Allergies  Allergen Reactions  . Shellfish Allergy Swelling    Social History   Socioeconomic History  . Marital status: Divorced    Spouse name: Not on file  . Number of children: Not on file  . Years of education: Not on file  . Highest education level: Not on file  Occupational History  . Not on file  Social Needs  . Financial resource strain: Not on file  . Food insecurity:    Worry: Not on file    Inability: Not on file  . Transportation needs:      Medical: Not on file    Non-medical: Not on file  Tobacco Use  . Smoking status: Never Smoker  . Smokeless tobacco: Never Used  Substance and Sexual Activity  . Alcohol use: Yes    Alcohol/week: 1.0 standard drinks    Types: 1 Glasses of wine per week  . Drug use: Never  . Sexual activity: Not Currently  Lifestyle  . Physical activity:    Days per week: Not on file    Minutes per session: Not on file  . Stress: Not on file  Relationships  . Social connections:    Talks on phone: Not on file    Gets together: Not on file    Attends religious service: Not on file    Active member of club or organization: Not on file    Attends meetings of clubs or organizations: Not on file    Relationship status:  Not on file  . Intimate partner violence:    Fear of current or ex partner: Not on file    Emotionally abused: Not on file    Physically abused: Not on file    Forced sexual activity: Not on file  Other Topics Concern  . Not on file  Social History Narrative  . Not on file     Family History: both parents with pacemakers   Review of Systems: All other systems reviewed and are otherwise negative except as noted above.  Physical Exam: Vitals:   08/15/18 0300 08/15/18 0400 08/15/18 0500 08/15/18 0705  BP: (!) 105/50 118/66  113/78  Pulse: (!) 34 64  (!) 36  Resp: 14 18  11  Temp:  97.9 F (36.6 C)  98.1 F (36.7 C)  TempSrc:  Oral    SpO2: 95% 97%  100%  Weight:   90.7 kg   Height:        GEN- The patient is well appearing, alert and oriented x 3 today.   HEENT: normocephalic, atraumatic; sclera clear, conjunctiva pink; hearing intact; oropharynx clear; neck supple Lungs- Clear to ausculation bilaterally, normal work of breathing.  No wheezes, rales, rhonchi Heart- Bradycardic regular rate and rhythm  GI- soft, non-tender, non-distended, bowel sounds present Extremities- no clubbing, cyanosis, or edema  MS- no significant deformity or atrophy Skin- warm and dry, no rash or lesion Psych- euthymic mood, full affect Neuro- strength and sensation are intact  Labs:   Lab Results  Component Value Date   WBC 8.3 08/15/2018   HGB 13.2 08/15/2018   HCT 38.8 (L) 08/15/2018   MCV 86.4 08/15/2018   PLT 205 08/15/2018    Recent Labs  Lab 08/15/18 0343  NA 138  K 4.0  CL 108  CO2 22  BUN 16  CREATININE 1.37*  CALCIUM 8.6*  GLUCOSE 96      Radiology/Studies: Dg Chest Portable 1 View  Result Date: 08/14/2018 CLINICAL DATA:  Shortness of breath and bradycardia. EXAM: PORTABLE CHEST 1 VIEW COMPARISON:  None. FINDINGS: Artifact overlies the chest. Heart and mediastinal shadows are normal. Lungs are clear. The vascularity is normal. No effusions. IMPRESSION: No active  disease. Electronically Signed   By: Mark  Shogry M.D.   On: 08/14/2018 17:36    EKG:2:1 heart block, rate 33, narrow QRS escape (personally reviewed)  TELEMETRY: intermittent 2:1 heart block (personally reviewed)  Assessment/Plan: 1.  Symptomatic intermittent 2:1 heart block The patient has symptomatic heart block with a narrow QRS escape. No reversible causes have been identified Cath this morning with normal LVEF   and no CAD Will plan pacemaker implant this afternoon. Risks, benefits reviewed with patient who wishes to proceed.  For questions or updates, please contact [facility] Please consult [email or web address] for contact info under Cardiology/STEMI.  Signed, Amber Seiler, NP 08/15/2018 9:36 AM  I have seen, examined the patient, and reviewed the above assessment and plan.  Changes to above are made where necessary.  On exam, RRR.  Pt with symptomatic 2:1 AV block. The patient has symptomatic bradycardia.  I would therefore recommend pacemaker implantation at this time.  Risks, benefits, alternatives to pacemaker implantation were discussed in detail with the patient today. The patient understands that the risks include but are not limited to bleeding, infection, pneumothorax, perforation, tamponade, vascular damage, renal failure, MI, stroke, death, and lead dislodgement and wishes to proceed.     Co Sign: Dewaun Kinzler, MD 08/15/2018 9:52 AM       

## 2018-08-15 NOTE — H&P (View-Only) (Signed)
ELECTROPHYSIOLOGY CONSULT NOTE    Patient ID: Brandon Ramirez MRN: [id number], DOB/AGE: Mar 22, 1961 57 y.o.  Admit date: 08/14/2018 Date of Consult: 08/15/2018  Primary Physician: Patient, No Pcp Per Primary Cardiologist: Nahser Electrophysiologist: Niobe Dick (new this admission)  Patient Profile: Brandon Ramirez is a 57 y.o. male with a history of ashtma who is being seen today for the evaluation of heart block at the request of Dr Elease Hashimoto.  HPI:  Brandon Ramirez is a 57 y.o. male with no significant past medical history.  He is very active typically riding bikes, ice skating, and routine exercise.  On 09/26/18 around 3PM, he was ice skating with his son and developed acute onset lightheadedness and shortness of breath. Since then, he has had persistent exercise intolerance and shortness of breath with exertion. He has not had frank syncope. He was seen by his PCP yesterday and his heart rate was in the 40's. He was sent to the ER for further evaluation.  He was found to be in 2:1 heart block with narrow QRS escape.  EP has been asked to evaluate for treatment options.  Catheterization this morning demonstrated normal LVEF and normal coronaries.  Echo is pending.    Both parents have pacemakers, dad at age 78, mom in the 21's  He denies chest pain, palpitations, PND, orthopnea, nausea, vomiting,  syncope, edema, weight gain, or early satiety.  Past Medical History:  Diagnosis Date  . Asthma   . Chronic lower back pain    "herniated disc between L3-4" (08/14/2018)  . GERD (gastroesophageal reflux disease)   . 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 History of kidney stones   . Stress at home    "related to custody" (08/14/2018)     Surgical History:  Past Surgical History:  Procedure Laterality Date  . NO PAST SURGERIES       Medications Prior to Admission  Medication Sig Dispense Refill Last Dose  . acetaminophen (TYLENOL) 325 MG tablet Take 650 mg by mouth every 6 (six) hours  as needed for mild pain.   unk at prn  . aspirin EC 81 MG tablet Take 81 mg by mouth every 4 (four) hours as needed for mild pain.   08/14/2018 at Unknown time  . cetirizine (ZYRTEC) 10 MG tablet Take 10 mg by mouth daily as needed for allergies.   unk at prn  . Glucosamine-Chondroitin 250-200 MG TABS Take 1 tablet by mouth daily as needed.   unk at prn  . ibuprofen (ADVIL,MOTRIN) 200 MG tablet Take 400 mg by mouth every 6 (six) hours as needed for mild pain.   08/14/2018 at Unknown time    Inpatient Medications:  . [MAR Hold] aspirin EC  81 mg Oral Daily  . gentamicin irrigation  80 mg Irrigation On Call  . [MAR Hold] heparin  5,000 Units Subcutaneous Q8H  . sodium chloride flush  3 mL Intravenous Q12H    Allergies:  Allergies  Allergen Reactions  . Shellfish Allergy Swelling    Social History   Socioeconomic History  . Marital status: Divorced    Spouse name: Not on file  . Number of children: Not on file  . Years of education: Not on file  . Highest education level: Not on file  Occupational History  . Not on file  Social Needs  . Financial resource strain: Not on file  . Food insecurity:    Worry: Not on file    Inability: Not on file  . Transportation needs:  Medical: Not on file    Non-medical: Not on file  Tobacco Use  . Smoking status: Never Smoker  . Smokeless tobacco: Never Used  Substance and Sexual Activity  . Alcohol use: Yes    Alcohol/week: 1.0 standard drinks    Types: 1 Glasses of wine per week  . Drug use: Never  . Sexual activity: Not Currently  Lifestyle  . Physical activity:    Days per week: Not on file    Minutes per session: Not on file  . Stress: Not on file  Relationships  . Social connections:    Talks on phone: Not on file    Gets together: Not on file    Attends religious service: Not on file    Active member of club or organization: Not on file    Attends meetings of clubs or organizations: Not on file    Relationship status:  Not on file  . Intimate partner violence:    Fear of current or ex partner: Not on file    Emotionally abused: Not on file    Physically abused: Not on file    Forced sexual activity: Not on file  Other Topics Concern  . Not on file  Social History Narrative  . Not on file     Family History: both parents with pacemakers   Review of Systems: All other systems reviewed and are otherwise negative except as noted above.  Physical Exam: Vitals:   08/15/18 0300 08/15/18 0400 08/15/18 0500 08/15/18 0705  BP: (!) 105/50 118/66  113/78  Pulse: (!) 34 64  (!) 36  Resp: 14 18  11   Temp:  97.9 F (36.6 C)  98.1 F (36.7 C)  TempSrc:  Oral    SpO2: 95% 97%  100%  Weight:   90.7 kg   Height:        GEN- The patient is well appearing, alert and oriented x 3 today.   HEENT: normocephalic, atraumatic; sclera clear, conjunctiva pink; hearing intact; oropharynx clear; neck supple Lungs- Clear to ausculation bilaterally, normal work of breathing.  No wheezes, rales, rhonchi Heart- Bradycardic regular rate and rhythm  GI- soft, non-tender, non-distended, bowel sounds present Extremities- no clubbing, cyanosis, or edema  MS- no significant deformity or atrophy Skin- warm and dry, no rash or lesion Psych- euthymic mood, full affect Neuro- strength and sensation are intact  Labs:   Lab Results  Component Value Date   WBC 8.3 08/15/2018   HGB 13.2 08/15/2018   HCT 38.8 (L) 08/15/2018   MCV 86.4 08/15/2018   PLT 205 08/15/2018    Recent Labs  Lab 08/15/18 0343  NA 138  K 4.0  CL 108  CO2 22  BUN 16  CREATININE 1.37*  CALCIUM 8.6*  GLUCOSE 96      Radiology/Studies: Dg Chest Portable 1 View  Result Date: 08/14/2018 CLINICAL DATA:  Shortness of breath and bradycardia. EXAM: PORTABLE CHEST 1 VIEW COMPARISON:  None. FINDINGS: Artifact overlies the chest. Heart and mediastinal shadows are normal. Lungs are clear. The vascularity is normal. No effusions. IMPRESSION: No active  disease. Electronically Signed   By: Paulina Fusi M.D.   On: 08/14/2018 17:36    EKG:2:1 heart block, rate 33, narrow QRS escape (personally reviewed)  TELEMETRY: intermittent 2:1 heart block (personally reviewed)  Assessment/Plan: 1.  Symptomatic intermittent 2:1 heart block The patient has symptomatic heart block with a narrow QRS escape. No reversible causes have been identified Cath this morning with normal LVEF  and no CAD Will plan pacemaker implant this afternoon. Risks, benefits reviewed with patient who wishes to proceed.  For questions or updates, please contact [facility] Please consult [email or web address] for contact info under Cardiology/STEMI.  Signed, Gypsy Balsam, NP 08/15/2018 9:36 AM  I have seen, examined the patient, and reviewed the above assessment and plan.  Changes to above are made where necessary.  On exam, RRR.  Pt with symptomatic 2:1 AV block. The patient has symptomatic bradycardia.  I would therefore recommend pacemaker implantation at this time.  Risks, benefits, alternatives to pacemaker implantation were discussed in detail with the patient today. The patient understands that the risks include but are not limited to bleeding, infection, pneumothorax, perforation, tamponade, vascular damage, renal failure, MI, stroke, death, and lead dislodgement and wishes to proceed.     Co Sign: [name], MD 08/15/2018 9:52 AM

## 2018-08-15 NOTE — Interval H&P Note (Signed)
History and Physical Interval Note:  08/15/2018 9:55 AM  Brandon Ramirez  has presented today for surgery, with the diagnosis of hb  The various methods of treatment have been discussed with the patient and family. After consideration of risks, benefits and other options for treatment, the patient has consented to  Procedure(s): PACEMAKER IMPLANT (N/A) as a surgical intervention .  The patient's history has been reviewed, patient examined, no change in status, stable for surgery.  I have reviewed the patient's chart and labs.  Questions were answered to the patient's satisfaction.     [name]

## 2018-08-15 NOTE — Progress Notes (Signed)
  Echocardiogram 2D Echocardiogram has been performed.  Delcie Roch 08/15/2018, 2:39 PM

## 2018-08-15 NOTE — Interval H&P Note (Signed)
History and Physical Interval Note:  08/15/2018 8:44 AM  Brandon Ramirez  has presented today for cardiac catheterization, with the diagnosis of shortness of breath and 2:1 AV block.  The various methods of treatment have been discussed with the patient and family. After consideration of risks, benefits and other options for treatment, the patient has consented to  Procedure(s): LEFT HEART CATH AND CORONARY ANGIOGRAPHY (N/A) as a surgical intervention .  The patient's history has been reviewed, patient examined, no change in status, stable for surgery.  I have reviewed the patient's chart and labs.  Questions were answered to the patient's satisfaction.    Cath Lab Visit (complete for each Cath Lab visit)  Clinical Evaluation Leading to the Procedure:   ACS: Yes.   (unstable angina; 2:1 AV block)  Non-ACS:  N/A  Lorne Winkels

## 2018-08-15 NOTE — Progress Notes (Signed)
Discharge instructions (including medications) discussed with and copy provided to patient/caregiver 

## 2018-08-15 NOTE — Discharge Instructions (Signed)
° ° °  Supplemental Discharge Instructions for  Pacemaker/Defibrillator Patients  Activity No heavy lifting or vigorous activity with your left/right arm for 6 to 8 weeks.  Do not raise your left/right arm above your head for one week.  Gradually raise your affected arm as drawn below.           __      08/20/18               08/21/18                   08/22/18                  08/23/18  NO DRIVING for   1 week  ; you may begin driving on   13/08/65  .  WOUND CARE - Keep the wound area clean and dry.  Do not get this area wet for one week. No showers for one week; you may shower on  08/23/18   . - The tape/steri-strips on your wound will fall off; do not pull them off.  No bandage is needed on the site.  DO  NOT apply any creams, oils, or ointments to the wound area. - If you notice any drainage or discharge from the wound, any swelling or bruising at the site, or you develop a fever > 101? F after you are discharged home, call the office at once.  Special Instructions - You are still able to use cellular telephones; use the ear opposite the side where you have your pacemaker/defibrillator.  Avoid carrying your cellular phone near your device. - When traveling through airports, show security personnel your identification card to avoid being screened in the metal detectors.  Ask the security personnel to use the hand wand. - Avoid arc welding equipment, MRI testing (magnetic resonance imaging), TENS units (transcutaneous nerve stimulators).  Call the office for questions about other devices. - Avoid electrical appliances that are in poor condition or are not properly grounded. - Microwave ovens are safe to be near or to operate.

## 2018-08-16 ENCOUNTER — Inpatient Hospital Stay (HOSPITAL_COMMUNITY): Payer: PRIVATE HEALTH INSURANCE

## 2018-08-16 ENCOUNTER — Other Ambulatory Visit: Payer: Self-pay

## 2018-08-16 LAB — BASIC METABOLIC PANEL
Anion gap: 9 (ref 5–15)
BUN: 15 mg/dL (ref 6–20)
CALCIUM: 8.9 mg/dL (ref 8.9–10.3)
CO2: 23 mmol/L (ref 22–32)
Chloride: 105 mmol/L (ref 98–111)
Creatinine, Ser: 1.37 mg/dL — ABNORMAL HIGH (ref 0.61–1.24)
GFR, EST NON AFRICAN AMERICAN: 56 mL/min — AB (ref >60)
Glucose, Bld: 113 mg/dL — ABNORMAL HIGH (ref 70–99)
Potassium: 4.4 mmol/L (ref 3.5–5.1)
SODIUM: 137 mmol/L (ref 135–145)

## 2018-08-16 NOTE — Care Management Note (Signed)
Case Management Note  Patient Details  Name: Brandon Ramirez MRN: [id number] Date of Birth: [birth year]  Subjective/Objective:    From home, pta indep, s/p pacemaker, he has insurance but no pcp, NCM gave patient the [facility] information to assit him in finding a pcp in network.                Action/Plan: DC home when ready.  Expected Discharge Date:                  Expected Discharge Plan:  Home/Self Care  In-House Referral:     Discharge planning Services  CM Consult  Post Acute Care Choice:    Choice offered to:     DME Arranged:    DME Agency:     HH Arranged:    HH Agency:     Status of Service:  Completed, signed off  If discussed at [employer] of Stay Meetings, dates discussed:    Additional Comments:  Leone Haven, RN 08/16/2018, 9:09 AM

## 2018-08-18 ENCOUNTER — Encounter (HOSPITAL_COMMUNITY): Payer: Self-pay | Admitting: Internal Medicine

## 2018-08-19 MED FILL — Verapamil HCl IV Soln 2.5 MG/ML: INTRAVENOUS | Qty: 2 | Status: AC

## 2018-08-27 ENCOUNTER — Ambulatory Visit (INDEPENDENT_AMBULATORY_CARE_PROVIDER_SITE_OTHER): Payer: PRIVATE HEALTH INSURANCE | Admitting: *Deleted

## 2018-08-27 DIAGNOSIS — I4430 Unspecified atrioventricular block: Secondary | ICD-10-CM

## 2018-08-29 LAB — CUP PACEART INCLINIC DEVICE CHECK
Battery Voltage: 3.16 V
Brady Statistic AP VP Percent: 0.34 %
Brady Statistic AS VP Percent: 99.56 %
Brady Statistic RV Percent Paced: 99.9 %
Date Time Interrogation Session: 20191030201959
Implantable Lead Implant Date: 20191018
Implantable Lead Location: 753859
Implantable Pulse Generator Implant Date: 20191018
Lead Channel Impedance Value: 285 Ohm
Lead Channel Impedance Value: 361 Ohm
Lead Channel Impedance Value: 418 Ohm
Lead Channel Impedance Value: 475 Ohm
Lead Channel Pacing Threshold Amplitude: 0.875 V
Lead Channel Pacing Threshold Amplitude: 1.25 V
Lead Channel Pacing Threshold Pulse Width: 0.4 ms
Lead Channel Pacing Threshold Pulse Width: 0.4 ms
Lead Channel Sensing Intrinsic Amplitude: 3.25 mV
Lead Channel Setting Pacing Amplitude: 5 V
Lead Channel Setting Pacing Pulse Width: 1 ms
MDC IDC LEAD IMPLANT DT: 20191018
MDC IDC LEAD LOCATION: 753860
MDC IDC MSMT BATTERY REMAINING LONGEVITY: 45 mo
MDC IDC MSMT LEADCHNL RA SENSING INTR AMPL: 3.125 mV
MDC IDC MSMT LEADCHNL RA SENSING INTR AMPL: 4.5 mV
MDC IDC MSMT LEADCHNL RV SENSING INTR AMPL: 1.375 mV
MDC IDC SET LEADCHNL RA PACING AMPLITUDE: 3.5 V
MDC IDC SET LEADCHNL RV SENSING SENSITIVITY: 0.9 mV
MDC IDC STAT BRADY AP VS PERCENT: 0 %
MDC IDC STAT BRADY AS VS PERCENT: 0.1 %
MDC IDC STAT BRADY RA PERCENT PACED: 0.34 %

## 2018-08-29 NOTE — Progress Notes (Signed)
Wound check appointment. Steri-strips removed. Wound without redness or edema. Incision edges approximated, wound well healed. Normal device function. Thresholds, sensing, and impedances consistent with implant measurements. HBP lead tested with use of rhythm strip, appears to be selective capture from 5V to LOC. Device programmed at 3.5V(RA)/5V(RV) for extra safety margin until 3 month visit. Histogram distribution appropriate for patient and level of activity. (1) AT episode x 69min37sec. No high ventricular rates noted. Patient educated about wound care, arm mobility, lifting restrictions. ROV in 3 months with JA.

## 2018-11-14 ENCOUNTER — Encounter: Payer: PRIVATE HEALTH INSURANCE | Admitting: Internal Medicine

## 2018-11-21 ENCOUNTER — Ambulatory Visit (INDEPENDENT_AMBULATORY_CARE_PROVIDER_SITE_OTHER): Payer: BLUE CROSS/BLUE SHIELD | Admitting: Internal Medicine

## 2018-11-21 ENCOUNTER — Encounter: Payer: Self-pay | Admitting: Internal Medicine

## 2018-11-21 ENCOUNTER — Encounter (INDEPENDENT_AMBULATORY_CARE_PROVIDER_SITE_OTHER): Payer: Self-pay

## 2018-11-21 VITALS — BP 124/80 | HR 82 | Ht 69.0 in | Wt 221.0 lb

## 2018-11-21 DIAGNOSIS — I4430 Unspecified atrioventricular block: Secondary | ICD-10-CM | POA: Diagnosis not present

## 2018-11-21 DIAGNOSIS — I10 Essential (primary) hypertension: Secondary | ICD-10-CM | POA: Diagnosis not present

## 2018-11-21 LAB — CUP PACEART INCLINIC DEVICE CHECK
Date Time Interrogation Session: 20200124170852
Implantable Lead Implant Date: 20191018
Implantable Lead Location: 753859
Implantable Lead Model: 5076
Implantable Pulse Generator Implant Date: 20191018
MDC IDC LEAD IMPLANT DT: 20191018
MDC IDC LEAD LOCATION: 753860

## 2018-11-21 NOTE — Progress Notes (Signed)
    PCP: Patient, No Pcp Per Primary Cardiologist: Dr Elease Hashimoto Primary EP:  Dr Johney Frame  Brandon Ramirez is a 58 y.o. male who presents today for routine electrophysiology followup.  Since pacemaker implant, the patient reports doing very well.  Today, he denies symptoms of palpitations, chest pain, shortness of breath,  lower extremity edema, dizziness, presyncope, or syncope.  The patient is otherwise without complaint today.   Past Medical History:  Diagnosis Date  . Asthma   . Chronic lower back pain    "herniated disc between L3-4" (08/14/2018)  . GERD (gastroesophageal reflux disease)   . 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 History of kidney stones   . Stress at home    "related to custody" (08/14/2018)   Past Surgical History:  Procedure Laterality Date  . LEFT HEART CATH AND CORONARY ANGIOGRAPHY N/A 08/15/2018   Procedure: LEFT HEART CATH AND CORONARY ANGIOGRAPHY;  Surgeon: Yvonne Kendall, MD;  Location: [facility];  Service: Cardiovascular;  Laterality: N/A;  . NO PAST SURGERIES    . PACEMAKER IMPLANT N/A 08/15/2018   Procedure: PACEMAKER IMPLANT;  Surgeon: [name], MD;  Location: [facility];  Service: Cardiovascular;  Laterality: N/A;    ROS- all systems are reviewed and negative except as per HPI above  Current Outpatient Medications  Medication Sig Dispense Refill  . acetaminophen (TYLENOL) 325 MG tablet Take 650 mg by mouth every 6 (six) hours as needed for mild pain.    . cetirizine (ZYRTEC) 10 MG tablet Take 10 mg by mouth daily as needed for allergies.    . diphenhydrAMINE HCl (BENADRYL ALLERGY PO) Take by mouth as needed.    . Glucosamine-Chondroitin 250-200 MG TABS Take 1 tablet by mouth daily as needed.    Marland Kitchen ibuprofen (ADVIL,MOTRIN) 200 MG tablet Take 400 mg by mouth every 6 (six) hours as needed for mild pain.    . multivitamin (ONE-A-DAY MEN'S) TABS tablet Take 1 tablet by mouth daily.     No current facility-administered medications  for this visit.     Physical Exam: Vitals:   11/21/18 1624  BP: 124/80  Pulse: 82  SpO2: 93%  Weight: 221 lb (100.2 kg)  Height: 5\' 9"  (1.753 m)    GEN- The patient is well appearing, alert and oriented x 3 today.   Head- normocephalic, atraumatic Eyes-  Sclera clear, conjunctiva pink Ears- hearing intact Oropharynx- clear Lungs- Clear to ausculation bilaterally, normal work of breathing Chest- pacemaker pocket is well healed Heart- Regular rate and rhythm, no murmurs, rubs or gallops, PMI not laterally displaced GI- soft, NT, ND, + BS Extremities- no clubbing, cyanosis, or edema  Pacemaker interrogation- reviewed in detail today,  See PACEART report  ekg tracing ordered today is personally reviewed and shows sinus with V pacing (His capture)  Assessment and Plan:  1. Symptomatic second degree heart block Normal pacemaker function See Pace Art report No changes today RV threshold today is 1V @ 1 msec.  He is programmed today 2.5V @ 1 msec.  2. HTN Stable No change required today  [facility] Return in a year to see EP NP  [name] MD, [facility] 11/21/2018 4:34 PM

## 2018-11-21 NOTE — Patient Instructions (Signed)
Medication Instructions:  No change If you need a refill on your cardiac medications before your next appointment, please call your pharmacy.   Lab work: none If you have labs (blood work) drawn today and your tests are completely normal, you will receive your results only by: Marland Kitchen. MyChart Message (if you have MyChart) OR . A paper copy in the mail If you have any lab test that is abnormal or we need to change your treatment, we will call you to review the results.  Testing/Procedures: none  Your physician recommends that you schedule a follow-up appointment in: 6 months with Gypsy BalsamAmber Seiler, NP.  Remote monitoring is used to monitor your Pacemaker of ICD from home. This monitoring reduces the number of office visits required to check your device to one time per year. It allows us to keep an eye on the functioning of your device to ensure it is working properly. You are scheduled for a device check from home on 02/23/19. You may send your transmission at any time that day. If you have a wireless device, the transmission will be sent automatically. After your physician reviews your transmission, you will receive a postcard with your next transmission date.   Any Other Special Instructions Will Be Listed Below (If Applicable).

## 2019-02-17 ENCOUNTER — Telehealth: Payer: Self-pay | Admitting: *Deleted

## 2019-02-17 NOTE — Telephone Encounter (Signed)
LMOVM requesting call back to DC. Gave direct DC number for return call.  Received triage alert for high RV (His) threshold, RV output automatically increased to 5.0V @ 1.51ms. Presenting rhythm shows As/Vp rhythm, RV lead appears to be capturing appropriately. Pt does not appear to be pacemaker-dependent per previous device checks, Vp 78.5% (with MVP enabled), implanted for 2:1 HB. As of 11/21/18 OV, pt's RV threshold was 2.375V @ 0.50ms (per autocapture) and 1.0V @ 1.93ms, output was programmed 2.5V @ 1.53ms.   Will need in-clinic check to confirm RV threshold, will plan to turn off autocapture and RV high threshold alert at that visit.

## 2019-02-17 NOTE — Telephone Encounter (Signed)
Third attempt:  Called patient, no answer, did not leave third message. No DPR on file. Will try again tomorrow.   Presenting rhythm from this morning's transmission also reviewed by Gypsy Balsam, NP--appears appropriate capture. RV impedance and sensing trends stable since last check.

## 2019-02-17 NOTE — Telephone Encounter (Signed)
Second attempt:  LMOVM requesting call back to DC.

## 2019-02-18 NOTE — Telephone Encounter (Signed)
Left message with receptionist at Dr. Larina Earthly office (EC number) requesting call back to my direct number. Will try to obtain any updated contact info for patient.

## 2019-02-18 NOTE — Telephone Encounter (Addendum)
Spoke with Dr. Antonietta Barcelona. He reports he is also unable to reach the patient and that it is unusual for him to not answer the phone. Confirmed phone number, no other numbers. Pt should be home. Pt lives alone. No one else is local. Dr. Liam Rogers is in agreement that a welfare check by police would be appropriate.  Pt called back, reports police just left. He reports he has been feeling well. Denies ShOB, dizziness, chest discomfort, or syncope. Advised of recommendation to come into the office as soon as possible for manual threshold testing. Pt declines, reports there is no way he can afford to come in to the office. Reports he won't even go to the ED at this point.  He is out of work right now and does not have insurance. 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 He is under a lot of stress, reports unemployment will not even cover his child support. Advised patient I will confirm recommendations with Dr. Johney Frame and call him back. Also advised patient to call his brother to tell him he is safe. Pt verbalizes understanding.  Pt gave verbal permission to leave detailed message on his VM with Dr. Jenel Lucks recommendations. Reports he will clean out his VM this afternoon.

## 2019-02-18 NOTE — Telephone Encounter (Addendum)
Had a long phone conversation with patient to advise of Dr. Jenel Lucks recommendations regarding coming in for manual pacemaker check. Discussed reduced battery estimate (3.2 years) with RV output at max output. Advised that PPM is V-pacing 78.% of the time. Explained that we have no way to know what his underlying rhythm is via a remote transmission. Explained that some patients become more PPM-dependent over time. Pt reports he was not aware of this and didn't realize he is using his PPM so much. He asked how much a PPM check in-clinic would cost. Advised I will check with the billing department and get back to him tomorrow. He declines to schedule until he "has the numbers." ED precautions given for presyncopal/syncopal episodes. Pt verbalizes understanding and thanked me for my call.

## 2019-02-18 NOTE — Telephone Encounter (Signed)
Fourth attempt:  LMOVM requesting call back to DC.

## 2019-02-19 NOTE — Telephone Encounter (Signed)
Received cost estimate from billing department:  For Mr. Thiesen, his estimated OOP cost would be $84.60. Because he is self-pay, his charge would be discounted 55%.    [id number]     $188  (w/discount 84.60)   [id number]  $123  (w/discount 55.35)   [id number]  $65   (w/discount 29.25)

## 2019-02-19 NOTE — Telephone Encounter (Signed)
Spoke with patient. Made him aware of cost estimate for appointment. He declines to schedule an appointment at this time, reports he will have to review his finances. Pt agrees to call the DC if/when he is ready to schedule an appointment. Direct number given. He denies additional questions at this time and thanked me for my call.

## 2019-02-23 ENCOUNTER — Other Ambulatory Visit: Payer: Self-pay

## 2019-02-23 ENCOUNTER — Ambulatory Visit (INDEPENDENT_AMBULATORY_CARE_PROVIDER_SITE_OTHER): Payer: PRIVATE HEALTH INSURANCE | Admitting: *Deleted

## 2019-02-23 DIAGNOSIS — I4430 Unspecified atrioventricular block: Secondary | ICD-10-CM

## 2019-02-23 LAB — CUP PACEART REMOTE DEVICE CHECK
Battery Remaining Longevity: 39 mo
Battery Voltage: 3 V
Brady Statistic AP VP Percent: 1.31 %
Brady Statistic AP VS Percent: 3.46 %
Brady Statistic AS VP Percent: 35.83 %
Brady Statistic AS VS Percent: 59.4 %
Brady Statistic RA Percent Paced: 4.33 %
Brady Statistic RV Percent Paced: 37.14 %
Date Time Interrogation Session: 20200427043513
Implantable Lead Implant Date: 20191018
Implantable Lead Implant Date: 20191018
Implantable Lead Location: 753859
Implantable Lead Location: 753860
Implantable Lead Model: 3830
Implantable Lead Model: 5076
Implantable Pulse Generator Implant Date: 20191018
Lead Channel Impedance Value: 304 Ohm
Lead Channel Impedance Value: 342 Ohm
Lead Channel Impedance Value: 437 Ohm
Lead Channel Impedance Value: 494 Ohm
Lead Channel Pacing Threshold Amplitude: 0.875 V
Lead Channel Pacing Threshold Amplitude: 2.125 V
Lead Channel Pacing Threshold Pulse Width: 0.4 ms
Lead Channel Pacing Threshold Pulse Width: 0.4 ms
Lead Channel Sensing Intrinsic Amplitude: 2 mV
Lead Channel Sensing Intrinsic Amplitude: 2 mV
Lead Channel Sensing Intrinsic Amplitude: 6.375 mV
Lead Channel Sensing Intrinsic Amplitude: 6.375 mV
Lead Channel Setting Pacing Amplitude: 1.75 V
Lead Channel Setting Pacing Amplitude: 5 V
Lead Channel Setting Pacing Pulse Width: 1 ms
Lead Channel Setting Sensing Sensitivity: 0.9 mV

## 2019-03-03 NOTE — Progress Notes (Signed)
Remote pacemaker transmission.   

## 2019-05-25 ENCOUNTER — Ambulatory Visit (INDEPENDENT_AMBULATORY_CARE_PROVIDER_SITE_OTHER): Payer: PRIVATE HEALTH INSURANCE | Admitting: *Deleted

## 2019-05-25 DIAGNOSIS — I4430 Unspecified atrioventricular block: Secondary | ICD-10-CM

## 2019-05-25 LAB — CUP PACEART REMOTE DEVICE CHECK
Battery Remaining Longevity: 71 mo
Battery Voltage: 2.97 V
Brady Statistic AP VP Percent: 5.63 %
Brady Statistic AP VS Percent: 3.41 %
Brady Statistic AS VP Percent: 54.03 %
Brady Statistic AS VS Percent: 36.94 %
Brady Statistic RA Percent Paced: 8.92 %
Brady Statistic RV Percent Paced: 59.66 %
Date Time Interrogation Session: 20200727071902
Implantable Lead Implant Date: 20191018
Implantable Lead Implant Date: 20191018
Implantable Lead Location: 753859
Implantable Lead Location: 753860
Implantable Lead Model: 3830
Implantable Lead Model: 5076
Implantable Pulse Generator Implant Date: 20191018
Lead Channel Impedance Value: 285 Ohm
Lead Channel Impedance Value: 342 Ohm
Lead Channel Impedance Value: 418 Ohm
Lead Channel Impedance Value: 532 Ohm
Lead Channel Pacing Threshold Amplitude: 0.875 V
Lead Channel Pacing Threshold Amplitude: 1.875 V
Lead Channel Pacing Threshold Pulse Width: 0.4 ms
Lead Channel Pacing Threshold Pulse Width: 0.4 ms
Lead Channel Sensing Intrinsic Amplitude: 2.625 mV
Lead Channel Sensing Intrinsic Amplitude: 2.625 mV
Lead Channel Sensing Intrinsic Amplitude: 7.375 mV
Lead Channel Sensing Intrinsic Amplitude: 7.375 mV
Lead Channel Setting Pacing Amplitude: 1.75 V
Lead Channel Setting Pacing Amplitude: 5 V
Lead Channel Setting Pacing Pulse Width: 0.4 ms
Lead Channel Setting Sensing Sensitivity: 0.9 mV

## 2019-06-10 NOTE — Progress Notes (Signed)
Remote pacemaker transmission.   

## 2019-07-26 ENCOUNTER — Encounter: Payer: Self-pay | Admitting: Nurse Practitioner

## 2019-07-26 NOTE — Progress Notes (Signed)
Electrophysiology Office Note Date: 07/27/2019  ID:  Brandon Ramirez, DOB 1961-03-14, MRN [id number]  PCP: Patient, No Pcp Per Primary Cardiologist: Nahser Electrophysiologist: Allred  CC: Pacemaker follow-up  Brandon Ramirez is a 58 y.o. male seen today for Dr Rayann Heman.  He presents today for routine electrophysiology followup.  Since last being seen in our clinic, the patient reports doing relatively well.  He continues with ongoing family stress related to custody of his son.   He denies chest pain, palpitations, dyspnea, PND, orthopnea, nausea, vomiting, dizziness, syncope, edema, weight gain, or early satiety.  Device History: MDT dual chamber HIS bundle PPM implanted 2019 for MobitzII   Past Medical History:  Diagnosis Date  . Asthma   . Chronic lower back pain    "herniated disc between L3-4" (08/14/2018)  . GERD (gastroesophageal reflux disease)   . 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 History of kidney stones   . Stress at home    "related to custody" (08/14/2018)   Past Surgical History:  Procedure Laterality Date  . LEFT HEART CATH AND CORONARY ANGIOGRAPHY N/A 08/15/2018   Procedure: LEFT HEART CATH AND CORONARY ANGIOGRAPHY;  Surgeon: Nelva Bush, MD;  Location: [facility];  Service: Cardiovascular;  Laterality: N/A;  . 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 acetaminophen (TYLENOL) 325 MG tablet Take 650 mg by mouth every 6 (six) hours as needed for mild pain.    . cetirizine (ZYRTEC) 10 MG tablet Take 10 mg by mouth daily as needed for allergies.    . diphenhydrAMINE HCl (BENADRYL ALLERGY PO) Take by mouth as needed.    . Glucosamine-Chondroitin 250-200 MG TABS Take 1 tablet by mouth daily as needed.    Marland Kitchen ibuprofen (ADVIL,MOTRIN)  200 MG tablet Take 400 mg by mouth every 6 (six) hours as needed for mild pain.    . multivitamin (ONE-A-DAY MEN'S) TABS tablet Take 1 tablet by mouth daily.     No current facility-administered medications for this visit.     Allergies:   Shellfish allergy   Social History: Social History   Socioeconomic History  . Marital status: Divorced    Spouse name: Not on file  . Number of children: Not on file  . Years of education: Not on file  . Highest education level: Not on file  Occupational History  . Not on file  Social Needs  . Financial resource strain: Not on file  . Food insecurity    Worry: Not on file    Inability: Not on file  . Transportation needs    Medical: Not on file    Non-medical: Not on file  Tobacco Use  . Smoking status: Never Smoker  . Smokeless tobacco: Never Used  Substance and Sexual Activity  . Alcohol use: Yes    Alcohol/week: 1.0 standard drinks    Types: 1 Glasses of wine per week  . Drug use: Never  . Sexual activity: Not Currently  Lifestyle  . Physical activity    Days per week: Not on file    Minutes per session: Not on file  . Stress: Not on file  Relationships  . Social Herbalist on phone: Not on file    Gets together: Not on file    Attends  religious service: Not on file    Active member of club or organization: Not on file    Attends meetings of clubs or organizations: Not on file    Relationship status: Not on file  . Intimate partner violence    Fear of current or ex partner: Not on file    Emotionally abused: Not on file    Physically abused: Not on file    Forced sexual activity: Not on file  Other Topics Concern  . Not on file  Social History Narrative  . Not on file    Review of Systems: All other systems reviewed and are otherwise negative except as noted above.   Physical Exam: VS:  BP 122/70   Pulse 88   Ht 5\' 9"  (1.753 m)   Wt 220 lb 12.8 oz (100.2 kg)   SpO2 96%   BMI 32.61 kg/m  , BMI Body  mass index is 32.61 kg/m.  GEN- The patient is well appearing, alert and oriented x 3 today.   HEENT: normocephalic, atraumatic; sclera clear, conjunctiva pink; hearing intact; oropharynx clear; neck supple  Lungs- Clear to ausculation bilaterally, normal work of breathing.  No wheezes, rales, rhonchi Heart- Regular rate and rhythm  GI- soft, non-tender, non-distended, bowel sounds present  Extremities- no clubbing, cyanosis, or edema  MS- no significant deformity or atrophy Skin- warm and dry, no rash or lesion; PPM pocket well healed Psych- euthymic mood, full affect Neuro- strength and sensation are intact  PPM Interrogation- reviewed in detail today,  See PACEART report  EKG:  EKG is not ordered today.  Recent Labs: 08/14/2018: B Natriuretic Peptide 59.4; Magnesium 1.7; TSH 2.710 08/15/2018: Hemoglobin 13.2; Platelets 205 08/16/2018: BUN 15; Creatinine, Ser 1.37; Potassium 4.4; Sodium 137   Wt Readings from Last 3 Encounters:  07/27/19 220 lb 12.8 oz (100.2 kg)  11/21/18 221 lb (100.2 kg)  08/16/18 213 lb (96.6 kg)     Other studies Reviewed: Additional studies/ records that were reviewed today include: Dr 08/18/18 office notes   Assessment and Plan:  1.  Mobitz II Normal PPM function See Pace Art report No changes today RV HIS Bundle threshold stable today at 1.75V@1msec . Output fixed at 3.25V@1msec  today and autocapture turned to monitor - pt is not device dependent today  2.  HTN Stable No change required today   Current medicines are reviewed at length with the patient today.   The patient does not have concerns regarding his medicines.  The following changes were made today:  none  Labs/ tests ordered today include: none No orders of the defined types were placed in this encounter.    Disposition:   Follow up with [facility], Dr Jenel Lucks 1year     Signed, Johney Frame, NP 07/27/2019 8:30 AM  [facility] [address]  [place] [phone] (office) [phone] (fax)'

## 2019-07-27 ENCOUNTER — Other Ambulatory Visit: Payer: Self-pay

## 2019-07-27 ENCOUNTER — Encounter: Payer: Self-pay | Admitting: Nurse Practitioner

## 2019-07-27 ENCOUNTER — Ambulatory Visit (INDEPENDENT_AMBULATORY_CARE_PROVIDER_SITE_OTHER): Payer: PRIVATE HEALTH INSURANCE | Admitting: Nurse Practitioner

## 2019-07-27 VITALS — BP 122/70 | HR 88 | Ht 69.0 in | Wt 220.8 lb

## 2019-07-27 DIAGNOSIS — I4430 Unspecified atrioventricular block: Secondary | ICD-10-CM

## 2019-07-27 DIAGNOSIS — I10 Essential (primary) hypertension: Secondary | ICD-10-CM

## 2019-07-27 LAB — CUP PACEART INCLINIC DEVICE CHECK
Date Time Interrogation Session: 20200928083254
Implantable Lead Implant Date: 20191018
Implantable Lead Implant Date: 20191018
Implantable Lead Location: 753859
Implantable Lead Location: 753860
Implantable Lead Model: 3830
Implantable Lead Model: 5076
Implantable Pulse Generator Implant Date: 20191018

## 2019-07-27 NOTE — Patient Instructions (Signed)
Medication Instructions:  none If you need a refill on your cardiac medications before your next appointment, please call your pharmacy.   Lab work: none If you have labs (blood work) drawn today and your tests are completely normal, you will receive your results only by: Marland Kitchen MyChart Message (if you have MyChart) OR . A paper copy in the mail If you have any lab test that is abnormal or we need to change your treatment, we will call you to review the results.  Testing/Procedures: none  Follow-Up: At [facility], you and your health needs are our priority.  As part of our continuing mission to provide you with exceptional heart care, we have created designated Provider Care Teams.  These Care Teams include your primary Cardiologist (physician) and Advanced Practice Providers (APPs -  Physician Assistants and Nurse Practitioners) who all work together to provide you with the care you need, when you need it. You will need a follow up appointment in 1 years.  Please call our office 2 months in advance to schedule this appointment.  You may see Dr Rayann Heman or one of the following Advanced Practice Providers on your designated Care Team:   Chanetta Marshall, NP . Tommye Standard, PA-C  Any Other Special Instructions Will Be Listed Below (If Applicable). Remote monitoring is used to monitor your Pacemaker  from home. This monitoring reduces the number of office visits required to check your device to one time per year. It allows [place] to keep an eye on the functioning of your device to ensure it is working properly. You are scheduled for a device check from home on 10/27. You may send your transmission at any time that day. If you have a wireless device, the transmission will be sent automatically. After your physician reviews your transmission, you will receive a postcard with your next transmission date.

## 2019-08-25 ENCOUNTER — Ambulatory Visit (INDEPENDENT_AMBULATORY_CARE_PROVIDER_SITE_OTHER): Payer: PRIVATE HEALTH INSURANCE | Admitting: *Deleted

## 2019-08-25 DIAGNOSIS — I4430 Unspecified atrioventricular block: Secondary | ICD-10-CM

## 2019-08-26 LAB — CUP PACEART REMOTE DEVICE CHECK
Battery Remaining Longevity: 76 mo
Battery Voltage: 2.98 V
Brady Statistic AP VP Percent: 6.86 %
Brady Statistic AP VS Percent: 0.22 %
Brady Statistic AS VP Percent: 89.37 %
Brady Statistic AS VS Percent: 3.55 %
Brady Statistic RA Percent Paced: 7.06 %
Brady Statistic RV Percent Paced: 96.23 %
Date Time Interrogation Session: 20201027105139
Implantable Lead Implant Date: 20191018
Implantable Lead Implant Date: 20191018
Implantable Lead Location: 753859
Implantable Lead Location: 753860
Implantable Lead Model: 3830
Implantable Lead Model: 5076
Implantable Pulse Generator Implant Date: 20191018
Lead Channel Impedance Value: 304 Ohm
Lead Channel Impedance Value: 361 Ohm
Lead Channel Impedance Value: 456 Ohm
Lead Channel Impedance Value: 551 Ohm
Lead Channel Pacing Threshold Amplitude: 1 V
Lead Channel Pacing Threshold Amplitude: 2.5 V
Lead Channel Pacing Threshold Pulse Width: 0.4 ms
Lead Channel Pacing Threshold Pulse Width: 0.4 ms
Lead Channel Sensing Intrinsic Amplitude: 3.25 mV
Lead Channel Sensing Intrinsic Amplitude: 3.25 mV
Lead Channel Sensing Intrinsic Amplitude: 6.75 mV
Lead Channel Sensing Intrinsic Amplitude: 6.75 mV
Lead Channel Setting Pacing Amplitude: 2 V
Lead Channel Setting Pacing Amplitude: 3.25 V
Lead Channel Setting Pacing Pulse Width: 1 ms
Lead Channel Setting Sensing Sensitivity: 0.9 mV

## 2019-09-15 NOTE — Progress Notes (Signed)
Remote pacemaker transmission.   

## 2019-10-01 ENCOUNTER — Telehealth: Payer: Self-pay | Admitting: Internal Medicine

## 2019-10-01 NOTE — Telephone Encounter (Signed)
Scheduled for automatic transmission 10/02/19 as unable to reach patient today.

## 2019-10-01 NOTE — Telephone Encounter (Signed)
STAT if HR is under 50 or over 120 (normal HR is 60-100 beats per minute)  1) What is your heart rate? 50s  2) Do you have a log of your heart rate readings (document readings)? No, but patient said he would start   3) Do you have any other symptoms? Lack of energy, Lightheaded, headache. Similar symptoms to right before he had his pacemaker put in, but to a greater extent  Patient is worried about his pulse going down to the 50s. He has a pulse ox monitor, but does not use it all the time. He does not know when the pacemaker is supposed to kick in.  Patient normally has a heart rate in the 60s  Patient has apt scheduled to see Oda Kilts on Monday 12/07 at 10:30

## 2019-10-01 NOTE — Telephone Encounter (Signed)
I called the pt to get him to send a manual transmission with his home monitor but I got no answer no voicemail.

## 2019-10-01 NOTE — Telephone Encounter (Signed)
Spoke with pt and for the last 5 days has noted multiple episodes of lightheadedness and h/a. Per pt HR is suggested on Pulse ox at 59 Will forward to device to see if can get Device recording and  forward to Dr Allred for review and recommendations Per pt does not know what B/P is .

## 2019-10-05 ENCOUNTER — Ambulatory Visit (INDEPENDENT_AMBULATORY_CARE_PROVIDER_SITE_OTHER): Payer: Self-pay | Admitting: Student

## 2019-10-05 ENCOUNTER — Other Ambulatory Visit: Payer: Self-pay

## 2019-10-05 ENCOUNTER — Encounter: Payer: Self-pay | Admitting: Student

## 2019-10-05 VITALS — BP 162/90 | HR 75 | Ht 69.0 in | Wt 234.0 lb

## 2019-10-05 DIAGNOSIS — I4430 Unspecified atrioventricular block: Secondary | ICD-10-CM

## 2019-10-05 DIAGNOSIS — I10 Essential (primary) hypertension: Secondary | ICD-10-CM

## 2019-10-05 LAB — CUP PACEART INCLINIC DEVICE CHECK
Battery Remaining Longevity: 40 mo
Battery Voltage: 2.98 V
Brady Statistic AP VP Percent: 6.99 %
Brady Statistic AP VS Percent: 0.09 %
Brady Statistic AS VP Percent: 91.17 %
Brady Statistic AS VS Percent: 1.76 %
Brady Statistic RA Percent Paced: 7.21 %
Brady Statistic RV Percent Paced: 98.15 %
Date Time Interrogation Session: 20201207134758
Implantable Lead Implant Date: 20191018
Implantable Lead Implant Date: 20191018
Implantable Lead Location: 753859
Implantable Lead Location: 753860
Implantable Lead Model: 3830
Implantable Lead Model: 5076
Implantable Pulse Generator Implant Date: 20191018
Lead Channel Impedance Value: 304 Ohm
Lead Channel Impedance Value: 399 Ohm
Lead Channel Impedance Value: 532 Ohm
Lead Channel Impedance Value: 551 Ohm
Lead Channel Pacing Threshold Amplitude: 1.25 V
Lead Channel Pacing Threshold Amplitude: 2.5 V
Lead Channel Pacing Threshold Pulse Width: 0.4 ms
Lead Channel Pacing Threshold Pulse Width: 0.4 ms
Lead Channel Sensing Intrinsic Amplitude: 2.375 mV
Lead Channel Sensing Intrinsic Amplitude: 2.5 mV
Lead Channel Sensing Intrinsic Amplitude: 6.75 mV
Lead Channel Sensing Intrinsic Amplitude: 6.75 mV
Lead Channel Setting Pacing Amplitude: 2.5 V
Lead Channel Setting Pacing Amplitude: 5 V
Lead Channel Setting Pacing Pulse Width: 1 ms
Lead Channel Setting Sensing Sensitivity: 2 mV

## 2019-10-05 NOTE — Telephone Encounter (Signed)
Transmission reviewed. Presenting rhythm reports AS/VP 60s-70s w/PVCs vs. ? Intermittent RV LOC and escape beats. RV (HBP) output fixed at 3.25V @ 1.23ms. Not device dependent at last OV on 07/27/19.  Spoke with patient. He reports dizzy spells are still coming and going. He has checked his HR on pulse ox--dropping as low as 38bpm at rest, asymptomatic. He has not checked HR during dizzy spells. Patient is aware of appointment today with A. Tillery, PA and is aware we will check his PPM at this appointment. He also has been monitoring home BPs and will bring list. No additional questions at this time.  Routed to Dr. Rayann Heman as Juluis Rainier. Kristin Bruins, PA, made aware of transmission results.

## 2019-10-05 NOTE — Patient Instructions (Addendum)
Medication Instructions:  none *If you need a refill on your cardiac medications before your next appointment, please call your pharmacy*  Lab Work: none If you have labs (blood work) drawn today and your tests are completely normal, you will receive your results only by: Marland Kitchen MyChart Message (if you have MyChart) OR . A paper copy in the mail If you have any lab test that is abnormal or we need to change your treatment, we will call you to review the results.  Testing/Procedures: none  Follow-Up: Virtual visit with Dr Rayann Heman 3-4 weeks At [facility], you and your health needs are our priority.  As part of our continuing mission to provide you with exceptional heart care, we have created designated Provider Care Teams.  These Care Teams include your primary Cardiologist (physician) and Advanced Practice Providers (APPs -  Physician Assistants and Nurse Practitioners) who all work together to provide you with the care you need, when you need it.   Other Instructions Remote monitoring is used to monitor your Pacemaker  from home. This monitoring reduces the number of office visits required to check your device to one time per year. It allows [place] to keep an eye on the functioning of your device to ensure it is working properly. You are scheduled for a device check from home on 11/24/19. You may send your transmission at any time that day. If you have a wireless device, the transmission will be sent automatically. After your physician reviews your transmission, you will receive a postcard with your next transmission date.

## 2019-10-05 NOTE — Progress Notes (Signed)
Electrophysiology Office Note Date: 10/05/2019  ID:  Brandon Ramirez, DOB [birth year], MRN [id number]  PCP: Patient, No Pcp Per Primary Cardiologist: Kristeen MissPhilip Nahser, MD Electrophysiologist: Dr. Johney FrameAllred   CC: Pacemaker follow-up  Brandon Ramirez is a 58 y.o. male seen today for Dr. Johney FrameAllred . he presents today for add on due to "low heart rates" with dizziness. He has had dizzy spells that come and go.  He has noted several instances on his fit but of HRs in the "upper 30s and 40s".   His BP runs ~ 130-140 at home. He is under a tremendous amount of stress. He is having legal issues with his ex-wife, issues with his income, and issues with his job.  He denies chest pain. He denies syncope. No tachy palpitations, peripheral edema, PND, orthopnea,  early satiety, or neurological sequale.   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  Past Medical History:  Diagnosis Date  . Asthma   . Chronic lower back pain    "herniated disc between L3-4" (08/14/2018)  . GERD (gastroesophageal reflux disease)   . 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 History of kidney stones   . Stress at home    "related to custody" (08/14/2018)   Past Surgical History:  Procedure Laterality Date  . LEFT HEART CATH AND CORONARY ANGIOGRAPHY N/A 08/15/2018   Procedure: LEFT HEART CATH AND CORONARY ANGIOGRAPHY;  Surgeon: Yvonne KendallEnd, Christopher, MD;  Location: [facility];  Service: Cardiovascular;  Laterality: N/A;  . PACEMAKER IMPLANT N/A 08/15/2018   Medtronic Azure XT MRI conditional dual-chamber pacemaker with 3830 His Bundle lead implanted by Dr Johney FrameAllred for symptomatic  mobitz II second degree AV block    Current Outpatient Medications  Medication Sig Dispense Refill  . acetaminophen (TYLENOL) 325 MG tablet Take 650 mg by mouth every 6 (six) hours as needed for mild pain.    . cetirizine (ZYRTEC) 10 MG tablet Take 10 mg by mouth daily as  needed for allergies.    . diphenhydrAMINE HCl (BENADRYL ALLERGY PO) Take by mouth as needed.    . Glucosamine-Chondroitin 250-200 MG TABS Take 1 tablet by mouth daily as needed.    Marland Kitchen. ibuprofen (ADVIL,MOTRIN) 200 MG tablet Take 400 mg by mouth every 6 (six) hours as needed for mild pain.    . multivitamin (ONE-A-DAY MEN'S) TABS tablet Take 1 tablet by mouth daily.     No current facility-administered medications for this visit.     Allergies:   Shellfish allergy   Social History: Social History   Socioeconomic History  . Marital status: Divorced    Spouse name: Not on file  . Number of children: Not on file  . Years of education: Not on file  . Highest education level: Not on file  Occupational History  . Not on file  Social Needs  . Financial resource strain: Not on file  . Food insecurity    Worry: Not on file    Inability: Not on file  . Transportation needs    Medical: Not on file    Non-medical: Not on file  Tobacco Use  . Smoking status: Never Smoker  . Smokeless tobacco: Never Used  Substance and Sexual Activity  . Alcohol use: Yes    Alcohol/week: 1.0 standard drinks    Types: 1 Glasses of wine per week  . Drug use: Never  . Sexual activity: Not Currently  Lifestyle  . Physical activity  Days per week: Not on file    Minutes per session: Not on file  . Stress: Not on file  Relationships  . Social Herbalist on phone: Not on file    Gets together: Not on file    Attends religious service: Not on file    Active member of club or organization: Not on file    Attends meetings of clubs or organizations: Not on file    Relationship status: Not on file  . Intimate partner violence    Fear of current or ex partner: Not on file    Emotionally abused: Not on file    Physically abused: Not on file    Forced sexual activity: Not on file  Other Topics Concern  . Not on file  Social History Narrative  . Not on file   Family History: History  reviewed. No pertinent family history.  Review of Systems: All other systems reviewed and are otherwise negative except as noted above.  Physical Exam: Vitals:   10/05/19 1049  BP: (!) 162/90  Pulse: 75  SpO2: 98%  Weight: 234 lb (106.1 kg)  Height: 5\' 9"  (1.753 m)     GEN- The patient is well appearing, alert and oriented x 3 today.   HEENT: normocephalic, atraumatic; sclera clear, conjunctiva pink; hearing intact; oropharynx clear; neck supple  Lungs- Clear to ausculation bilaterally, normal work of breathing.  No wheezes, rales, rhonchi Heart- Regular rate and rhythm, no murmurs, rubs or gallops  GI- soft, non-tender, non-distended, bowel sounds present  Extremities- no clubbing, cyanosis, or edema  MS- no significant deformity or atrophy Skin- warm and dry, no rash or lesion; PPM pocket well healed Psych- euthymic mood, full affect Neuro- strength and sensation are intact  PPM Interrogation- reviewed in detail today,  See PACEART report  EKG:  EKG is ordered today. The ekg ordered today shows V pacing at 75 bpm, personally reviewed  Recent Labs: No results found for requested labs within last 8760 hours.   Wt Readings from Last 3 Encounters:  10/05/19 234 lb (106.1 kg)  07/27/19 220 lb 12.8 oz (100.2 kg)  11/21/18 221 lb (100.2 kg)     Other studies Reviewed: Additional studies/ records that were reviewed today include: Echo 07/2018 shows LVEF 60-65%, Previous EP office notes, Previous remote checks, Most recent labwork.   Assessment and Plan:  1.  Mobitz II s/p Medtronic PPM with HIS bundle pacing Dependent today, VP down to 30 with elevated RV threshold Thresholds 3.0V @ 1.0 ms bipolar, 2.75V @ 1.5 ms bipolar, and 2.25 V at 1.0 ms Unipolar but pt felt "thumping" near the can with unipolar.  Discussed with Dr. Rayann Heman. Options include lead extraction and revision vs lead revision with abandoning the old lead.  Pt is not interested in any procedure at this time (at  least this calendar year he says) due to legal proceedings and issues with his job. Pt knows to call right away with any worsening symptoms, or if his symptoms do not resolve.  Pt programmed at 5.0 V at 1.0 ms today. MVP programmed off, now on DDDR.  See Claudia Desanctis Art report  2. HTN Elevated on arrival. Runs 130-140s at home. Pt currently undergoing a divorce/fall out from a divorce complicated by extensive legal proceedings. He is having issues with his wages and his employment.  No change today. Pt states prior to this past week, his pressures were typically better.   Current medicines are reviewed at  length with the patient today.   The patient does not have concerns regarding his medicines.  The following changes were made today:  none  Labs/ tests ordered today include:  Orders Placed This Encounter  Procedures  . CUP PACEART INCLINIC DEVICE CHECK  . EKG 12-Lead   Disposition:   Follow up with Dr. Johney Frame in 2-4 weeks to discuss possible lead revision.   Dustin Flock, PA-C  10/05/2019 2:59 PM  [facility] [address] [place] [address] [phone] (office) [phone] (fax)

## 2019-10-26 ENCOUNTER — Encounter: Payer: Self-pay | Admitting: *Deleted

## 2019-11-04 ENCOUNTER — Telehealth (INDEPENDENT_AMBULATORY_CARE_PROVIDER_SITE_OTHER): Payer: Self-pay | Admitting: Internal Medicine

## 2019-11-04 ENCOUNTER — Encounter: Payer: Self-pay | Admitting: Internal Medicine

## 2019-11-04 VITALS — BP 96/71 | HR 83 | Ht 69.0 in | Wt 220.0 lb

## 2019-11-04 DIAGNOSIS — I10 Essential (primary) hypertension: Secondary | ICD-10-CM

## 2019-11-04 DIAGNOSIS — I442 Atrioventricular block, complete: Secondary | ICD-10-CM

## 2019-11-04 NOTE — Progress Notes (Signed)
Electrophysiology TeleHealth Note   Due to national recommendations of social distancing due to COVID 19, an audio/video telehealth visit is felt to be most appropriate for this patient at this time.  See MyChart message from today for the patient's consent to telehealth for [facility].  Date:  11/04/2019   ID:  Brandon Ramirez, DOB [birth year], MRN [id number]  Location: patient's home  Provider location:  [facility]  Evaluation Performed: Follow-up visit  PCP:  Patient, No Pcp Per   Electrophysiologist:  Dr Johney Frame  Chief Complaint:  palpitations  History of Present Illness:    Brandon Ramirez is a 59 y.o. male who presents via telehealth conferencing today.  Since last being seen in our clinic, the patient reports doing very well.  Today, he denies symptoms of palpitations, chest pain, shortness of breath,  lower extremity edema, dizziness, presyncope, or syncope.  The patient is otherwise without complaint today.  The patient denies symptoms of fevers, chills, cough, or new SOB worrisome for COVID 19.  Past Medical History:  Diagnosis Date  . Abnormal EKG 08/14/2018  . Asthma   . Chronic lower back pain    "herniated disc between L3-4" (08/14/2018)  . GERD (gastroesophageal reflux disease)   . 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 History of kidney stones   . 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 Left bundle branch block 10/28/2012  . Obesity (BMI 30.0-34.9) 01/22/2014  . Other and unspecified hyperlipidemia 10/29/2012  . Shortness of breath   . Stress at home    "related to custody" (08/14/2018)    Past Surgical History:  Procedure Laterality Date  . LEFT HEART CATH AND CORONARY ANGIOGRAPHY N/A 08/15/2018   Procedure: LEFT HEART CATH AND CORONARY ANGIOGRAPHY;  Surgeon: Yvonne Kendall, MD;  Location: [facility];  Service: Cardiovascular;  Laterality: N/A;  . PACEMAKER IMPLANT N/A 08/15/2018   Medtronic Azure XT  MRI conditional dual-chamber pacemaker with 3830 His Bundle lead implanted by Dr Johney Frame for symptomatic  mobitz II second degree AV block    Current Outpatient Medications  Medication Sig Dispense Refill  . acetaminophen (TYLENOL) 325 MG tablet Take 650 mg by mouth every 6 (six) hours as needed for mild pain.    . cetirizine (ZYRTEC) 10 MG tablet Take 10 mg by mouth daily as needed for allergies.    . diphenhydrAMINE HCl (BENADRYL ALLERGY PO) Take 1 tablet by mouth as needed (allergies).     . Glucosamine-Chondroit-Vit C-Mn (GLUCOSAMINE 1500 COMPLEX PO) Take 1 capsule by mouth daily.    Marland Kitchen ibuprofen (ADVIL,MOTRIN) 200 MG tablet Take 400 mg by mouth every 6 (six) hours as needed for mild pain.    . multivitamin (ONE-A-DAY MEN'S) TABS tablet Take 1 tablet by mouth daily.     No current facility-administered medications for this visit.    Allergies:   Shellfish allergy   Social History:  The patient  reports that he has never smoked. He has never used smokeless tobacco. He reports current alcohol use of about 1.0 standard drinks of alcohol per week. He reports that he does not use drugs.   Family History:  + HTN  ROS:  Please see the history of present illness.   All other systems are personally reviewed and negative.    Exam:    Vital Signs:  BP 96/71   Pulse 83   Ht 5\' 9"  (1.753 m)   Wt 220 lb (99.8 kg)   BMI 32.49  kg/m   Well sounding and appearing, alert and conversant, regular work of breathing,  good skin color Eyes- anicteric, neuro- grossly intact, skin- no apparent rash or lesions or cyanosis, mouth- oral mucosa is pink  Labs/Other Tests and Data Reviewed:    Recent Labs: No results found for requested labs within last 8760 hours.   Wt Readings from Last 3 Encounters:  11/04/19 220 lb (99.8 kg)  10/05/19 234 lb (106.1 kg)  07/27/19 220 lb 12.8 oz (100.2 kg)     Last device remote is reviewed from [facility] PDF which reveals normal device function, RV  threshold   ASSESSMENT & PLAN:    1.  Complete heart block Recently had an elevated RV lead pacing output from his 3830 His bundle lead. No issues clinically since increase of output 10/05/2019 (note reviewed) I have strongly advised that he have his current lead removed and a new left bundle pacing lead placed by Dr Lovena Le. Unfortunately, the patient lost his job in October and is very concerned about finances.  He states "I am gong through a divorce and am bankrupt".  He is therefore not willing to proceed at this time.  I told him that his current pacing system may not be reliable and that he is at risk for syncope/ sudden death.  He understands the risks but is clear that he will not proceed at this time.  He will contact my office when he is ready to proceed.  2. HTN Stable No change required today    Follow-up:  Continue remotes Follow-up with me in 6 months iin office for a device check   Patient Risk:  after full review of this patients clinical status, I feel that they are at moderate risk at this time.  Today, I have spent 15 minutes with the patient with telehealth technology discussing arrhythmia management .    Army Fossa, MD  11/04/2019 9:11 AM     [facility] [address] [place]  [place] [address] [phone] (office) [phone] (fax)

## 2019-11-24 ENCOUNTER — Ambulatory Visit (INDEPENDENT_AMBULATORY_CARE_PROVIDER_SITE_OTHER): Payer: PRIVATE HEALTH INSURANCE | Admitting: *Deleted

## 2019-11-24 DIAGNOSIS — I4430 Unspecified atrioventricular block: Secondary | ICD-10-CM

## 2019-11-24 LAB — CUP PACEART REMOTE DEVICE CHECK
Battery Remaining Longevity: 33 mo
Battery Voltage: 2.95 V
Brady Statistic AP VP Percent: 6.82 %
Brady Statistic AP VS Percent: 0 %
Brady Statistic AS VP Percent: 93.18 %
Brady Statistic AS VS Percent: 0 %
Brady Statistic RA Percent Paced: 6.81 %
Brady Statistic RV Percent Paced: 100 %
Date Time Interrogation Session: 20210125225113
Implantable Lead Implant Date: 20191018
Implantable Lead Implant Date: 20191018
Implantable Lead Location: 753859
Implantable Lead Location: 753860
Implantable Lead Model: 3830
Implantable Lead Model: 5076
Implantable Pulse Generator Implant Date: 20191018
Lead Channel Impedance Value: 304 Ohm
Lead Channel Impedance Value: 361 Ohm
Lead Channel Impedance Value: 456 Ohm
Lead Channel Impedance Value: 551 Ohm
Lead Channel Pacing Threshold Amplitude: 0.875 V
Lead Channel Pacing Threshold Amplitude: 2.5 V
Lead Channel Pacing Threshold Pulse Width: 0.4 ms
Lead Channel Pacing Threshold Pulse Width: 0.4 ms
Lead Channel Sensing Intrinsic Amplitude: 2.75 mV
Lead Channel Sensing Intrinsic Amplitude: 2.75 mV
Lead Channel Sensing Intrinsic Amplitude: 6.75 mV
Lead Channel Sensing Intrinsic Amplitude: 6.75 mV
Lead Channel Setting Pacing Amplitude: 1.75 V
Lead Channel Setting Pacing Amplitude: 5 V
Lead Channel Setting Pacing Pulse Width: 1 ms
Lead Channel Setting Sensing Sensitivity: 2 mV

## 2020-02-23 ENCOUNTER — Ambulatory Visit (INDEPENDENT_AMBULATORY_CARE_PROVIDER_SITE_OTHER): Payer: PRIVATE HEALTH INSURANCE | Admitting: *Deleted

## 2020-02-23 DIAGNOSIS — I4430 Unspecified atrioventricular block: Secondary | ICD-10-CM

## 2020-02-23 LAB — CUP PACEART REMOTE DEVICE CHECK
Battery Remaining Longevity: 29 mo
Battery Voltage: 2.94 V
Brady Statistic AP VP Percent: 8.78 %
Brady Statistic AP VS Percent: 0 %
Brady Statistic AS VP Percent: 91.22 %
Brady Statistic AS VS Percent: 0 %
Brady Statistic RA Percent Paced: 8.76 %
Brady Statistic RV Percent Paced: 100 %
Date Time Interrogation Session: 20210427000920
Implantable Lead Implant Date: 20191018
Implantable Lead Implant Date: 20191018
Implantable Lead Location: 753859
Implantable Lead Location: 753860
Implantable Lead Model: 3830
Implantable Lead Model: 5076
Implantable Pulse Generator Implant Date: 20191018
Lead Channel Impedance Value: 285 Ohm
Lead Channel Impedance Value: 323 Ohm
Lead Channel Impedance Value: 437 Ohm
Lead Channel Impedance Value: 608 Ohm
Lead Channel Pacing Threshold Amplitude: 1.25 V
Lead Channel Pacing Threshold Amplitude: 2.5 V
Lead Channel Pacing Threshold Pulse Width: 0.4 ms
Lead Channel Pacing Threshold Pulse Width: 0.4 ms
Lead Channel Sensing Intrinsic Amplitude: 2.625 mV
Lead Channel Sensing Intrinsic Amplitude: 2.625 mV
Lead Channel Sensing Intrinsic Amplitude: 6.75 mV
Lead Channel Sensing Intrinsic Amplitude: 6.75 mV
Lead Channel Setting Pacing Amplitude: 2.5 V
Lead Channel Setting Pacing Amplitude: 5 V
Lead Channel Setting Pacing Pulse Width: 1 ms
Lead Channel Setting Sensing Sensitivity: 2 mV

## 2020-02-24 NOTE — Progress Notes (Signed)
PPM Remote  
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
## 2020-05-24 ENCOUNTER — Ambulatory Visit (INDEPENDENT_AMBULATORY_CARE_PROVIDER_SITE_OTHER): Payer: Self-pay | Admitting: *Deleted

## 2020-05-24 DIAGNOSIS — I4430 Unspecified atrioventricular block: Secondary | ICD-10-CM

## 2020-05-25 LAB — CUP PACEART REMOTE DEVICE CHECK
Battery Remaining Longevity: 27 mo
Battery Voltage: 2.93 V
Brady Statistic AP VP Percent: 10.26 %
Brady Statistic AP VS Percent: 0 %
Brady Statistic AS VP Percent: 89.74 %
Brady Statistic AS VS Percent: 0 %
Brady Statistic RA Percent Paced: 10.24 %
Brady Statistic RV Percent Paced: 100 %
Date Time Interrogation Session: 20210727000311
Implantable Lead Implant Date: 20191018
Implantable Lead Implant Date: 20191018
Implantable Lead Location: 753859
Implantable Lead Location: 753860
Implantable Lead Model: 3830
Implantable Lead Model: 5076
Implantable Pulse Generator Implant Date: 20191018
Lead Channel Impedance Value: 304 Ohm
Lead Channel Impedance Value: 323 Ohm
Lead Channel Impedance Value: 456 Ohm
Lead Channel Impedance Value: 532 Ohm
Lead Channel Pacing Threshold Amplitude: 0.875 V
Lead Channel Pacing Threshold Amplitude: 2.5 V
Lead Channel Pacing Threshold Pulse Width: 0.4 ms
Lead Channel Pacing Threshold Pulse Width: 0.4 ms
Lead Channel Sensing Intrinsic Amplitude: 2.625 mV
Lead Channel Sensing Intrinsic Amplitude: 2.625 mV
Lead Channel Sensing Intrinsic Amplitude: 6.75 mV
Lead Channel Sensing Intrinsic Amplitude: 6.75 mV
Lead Channel Setting Pacing Amplitude: 1.75 V
Lead Channel Setting Pacing Amplitude: 5 V
Lead Channel Setting Pacing Pulse Width: 1 ms
Lead Channel Setting Sensing Sensitivity: 2 mV

## 2020-05-27 NOTE — Progress Notes (Signed)
Remote pacemaker transmission.   

## 2020-08-12 IMAGING — CR DG CHEST 2V
2 series · 2 of 2 positions shown · non-contrast
Comparison: One-view chest x-ray 08/14/2018.

CLINICAL DATA: Cardiac device in situ.

EXAM:
CHEST - 2 VIEW

[chest pa]
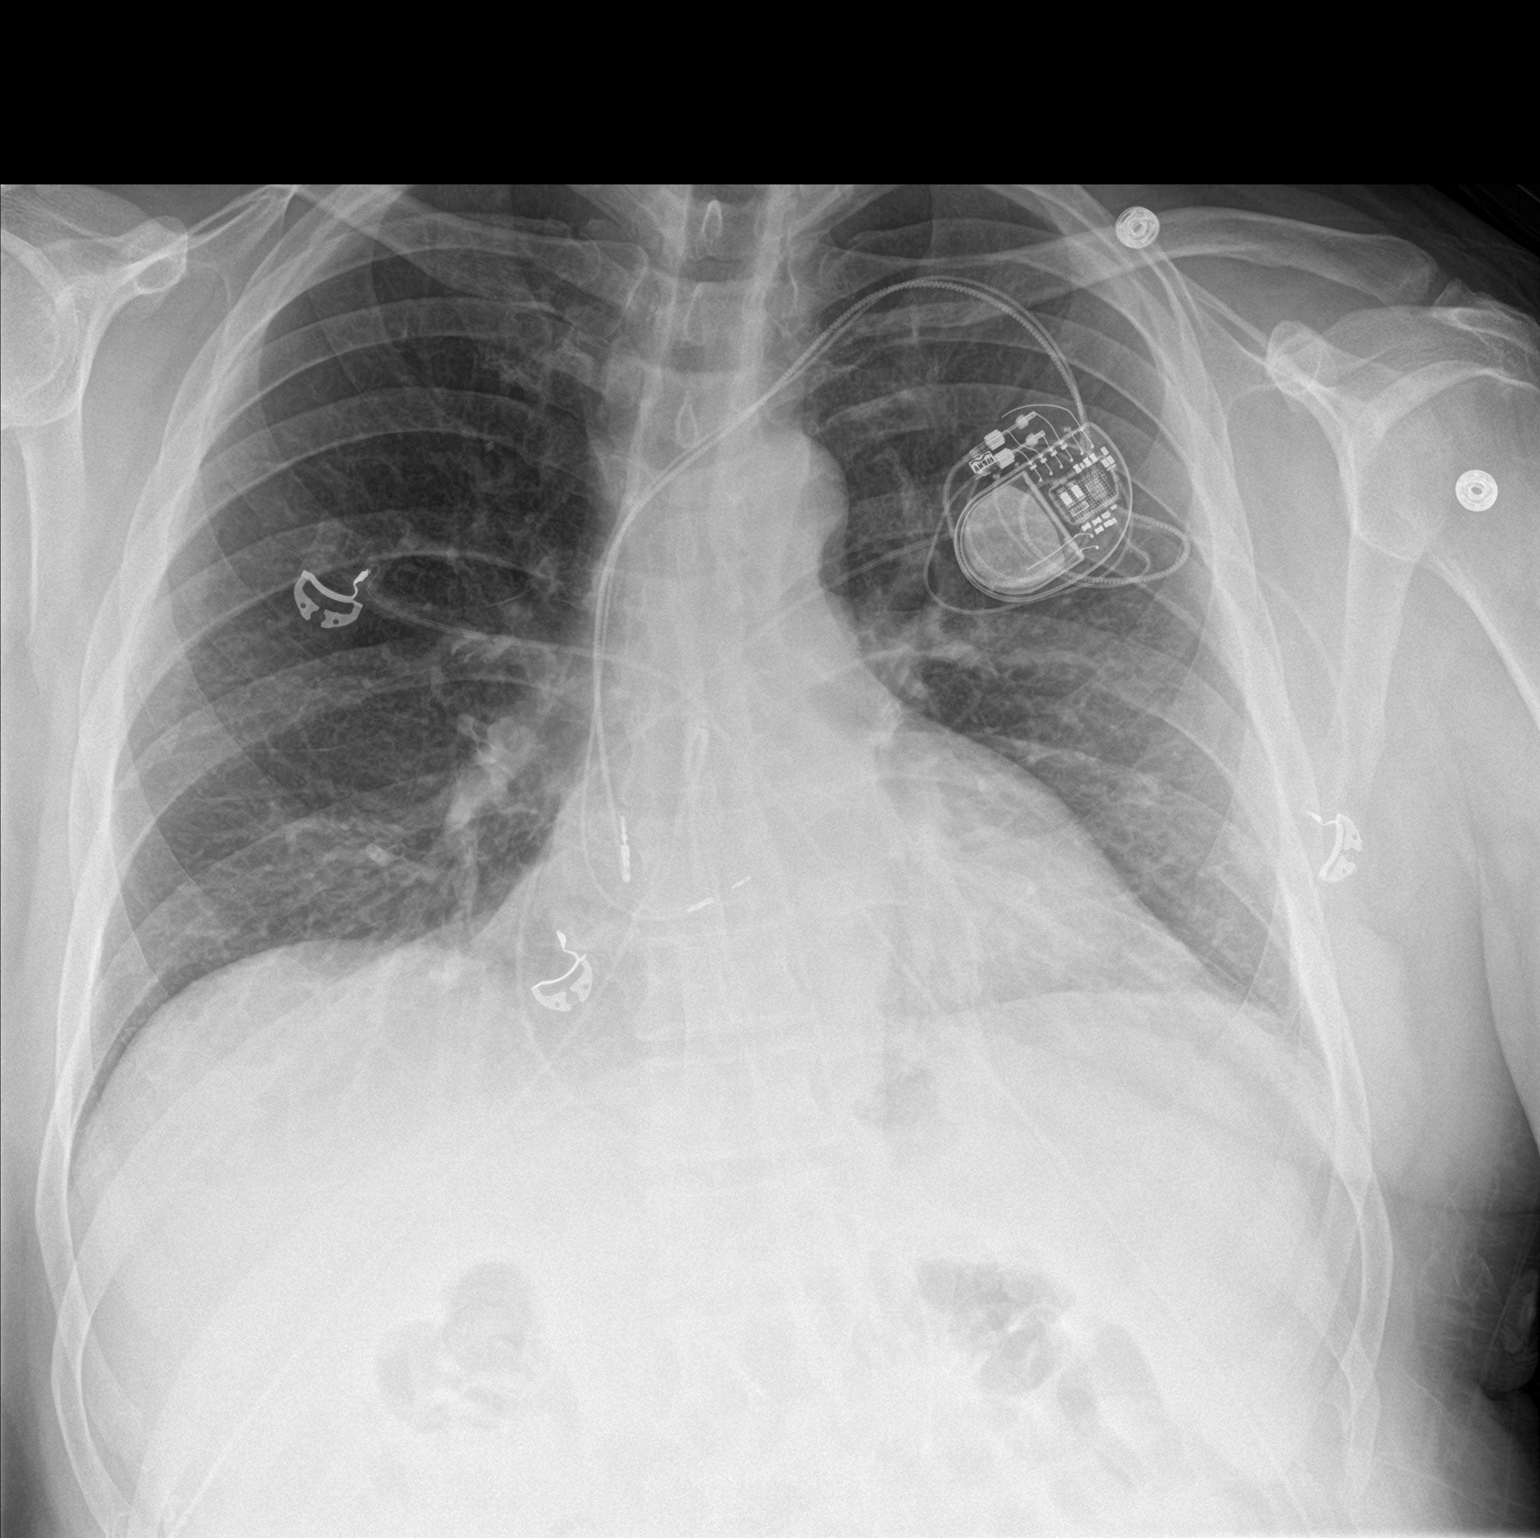

[chest lat]
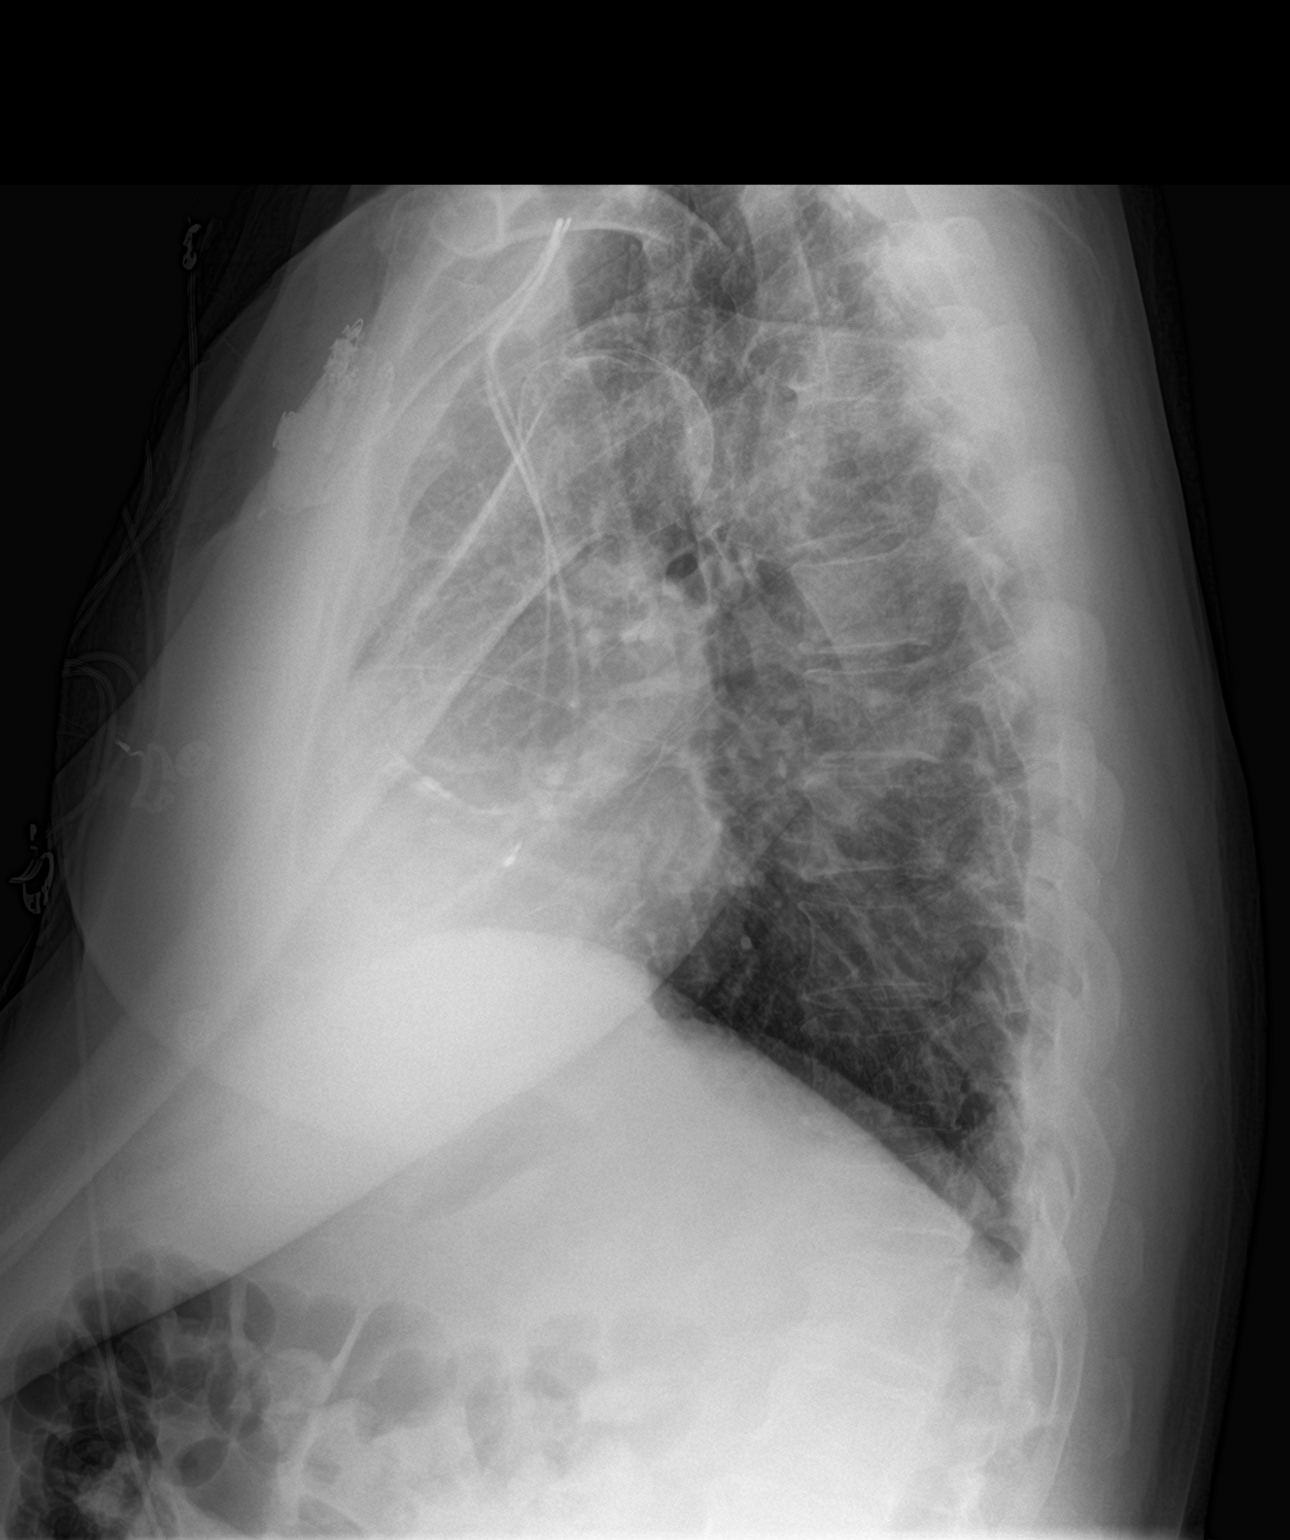

[2 of 2 positions shown; findings below may reference images not displayed]

FINDINGS: The heart size is normal. A dual-chamber pacemaker has been placed.
Leads terminate in the right atrium and right ventricle. There is no
pneumothorax. Mild bibasilar atelectasis is noted.
IMPRESSION: 1. Interval placement of dual chamber pacemaker without radiographic
evidence for complication.
2. Mild bibasilar atelectasis.

## 2020-08-23 ENCOUNTER — Telehealth: Payer: Self-pay | Admitting: Internal Medicine

## 2020-08-23 NOTE — Telephone Encounter (Signed)
Patient is called wanting to know how much his surgery is going to cost him.  He would like to know if he can get CPT codes he can give his insurance company. So this way he know what he would be responsible for prior to having his surgery done.

## 2020-08-23 NOTE — Telephone Encounter (Signed)
CPT codes given 33234, 33216 plus general information about hospital overnight stay, completed in the OR under general anesthesia.   Patient thankful for the information and will contact his insurance company.

## 2020-08-27 ENCOUNTER — Ambulatory Visit: Payer: Self-pay | Attending: Internal Medicine

## 2020-08-27 DIAGNOSIS — Z23 Encounter for immunization: Secondary | ICD-10-CM

## 2020-08-27 NOTE — Progress Notes (Signed)
   Covid-19 Vaccination Clinic  Name:  Brandon Ramirez    MRN: [id number] DOB: 1961/03/07  08/27/2020  Brandon Ramirez was observed post Covid-19 immunization for 15 minutes without incident. He was provided with Vaccine Information Sheet and instruction to access the V-Safe system.   Brandon Ramirez was instructed to call 911 with any severe reactions post vaccine: Marland Kitchen Difficulty breathing  . Swelling of face and throat  . A fast heartbeat  . A bad rash all over body  . Dizziness and weakness   Immunizations Administered    Name Date Dose VIS Date Route   JANSSEN COVID-19 VACCINE 08/27/2020  1:01 PM 0.5 mL 08/17/2020 Intramuscular   Manufacturer: [employer]   Lot: [id number]   NDC: [phone]

## 2020-11-22 ENCOUNTER — Ambulatory Visit (INDEPENDENT_AMBULATORY_CARE_PROVIDER_SITE_OTHER): Payer: Self-pay

## 2020-11-22 DIAGNOSIS — I442 Atrioventricular block, complete: Secondary | ICD-10-CM

## 2020-11-23 LAB — CUP PACEART REMOTE DEVICE CHECK
Battery Remaining Longevity: 15 mo
Battery Voltage: 2.9 V
Brady Statistic AP VP Percent: 7.21 %
Brady Statistic AP VS Percent: 0 %
Brady Statistic AS VP Percent: 92.77 %
Brady Statistic AS VS Percent: 0.01 %
Brady Statistic RA Percent Paced: 7.2 %
Brady Statistic RV Percent Paced: 99.99 %
Date Time Interrogation Session: 20220125175833
Implantable Lead Implant Date: 20191018
Implantable Lead Implant Date: 20191018
Implantable Lead Location: 753859
Implantable Lead Location: 753860
Implantable Lead Model: 3830
Implantable Lead Model: 5076
Implantable Pulse Generator Implant Date: 20191018
Lead Channel Impedance Value: 285 Ohm
Lead Channel Impedance Value: 323 Ohm
Lead Channel Impedance Value: 399 Ohm
Lead Channel Impedance Value: 418 Ohm
Lead Channel Pacing Threshold Amplitude: 0.75 V
Lead Channel Pacing Threshold Amplitude: 2.5 V
Lead Channel Pacing Threshold Pulse Width: 0.4 ms
Lead Channel Pacing Threshold Pulse Width: 0.4 ms
Lead Channel Sensing Intrinsic Amplitude: 1.75 mV
Lead Channel Sensing Intrinsic Amplitude: 1.75 mV
Lead Channel Sensing Intrinsic Amplitude: 6.75 mV
Lead Channel Sensing Intrinsic Amplitude: 6.75 mV
Lead Channel Setting Pacing Amplitude: 1.75 V
Lead Channel Setting Pacing Amplitude: 5 V
Lead Channel Setting Pacing Pulse Width: 1 ms
Lead Channel Setting Sensing Sensitivity: 2 mV

## 2020-12-03 NOTE — Progress Notes (Signed)
Remote pacemaker transmission.   

## 2020-12-16 ENCOUNTER — Encounter: Payer: Self-pay | Admitting: Internal Medicine

## 2020-12-16 ENCOUNTER — Ambulatory Visit (INDEPENDENT_AMBULATORY_CARE_PROVIDER_SITE_OTHER): Payer: Self-pay | Admitting: Internal Medicine

## 2020-12-16 ENCOUNTER — Other Ambulatory Visit: Payer: Self-pay

## 2020-12-16 VITALS — BP 122/84 | HR 81 | Ht 69.0 in | Wt 223.0 lb

## 2020-12-16 DIAGNOSIS — T82110A Breakdown (mechanical) of cardiac electrode, initial encounter: Secondary | ICD-10-CM | POA: Insufficient documentation

## 2020-12-16 DIAGNOSIS — I4430 Unspecified atrioventricular block: Secondary | ICD-10-CM

## 2020-12-16 DIAGNOSIS — Z950 Presence of cardiac pacemaker: Secondary | ICD-10-CM | POA: Insufficient documentation

## 2020-12-16 NOTE — Progress Notes (Signed)
HPI Brandon Ramirez is referred by Dr. Fawn Kirk for consideration for PM lead revision. He is a pleasant 60 yo man with CHB. He underwent insertion of a medtronic DDD PM with a His bundle lead in 2019. He has unfortunately developed worsening His bundle lead capture thresholds. He has about a year left on his device and presents to discuss treatment options. He has not had syncope since his device implant. He feels well.  Allergies  Allergen Reactions  . Shellfish Allergy Swelling     Current Outpatient Medications  Medication Sig Dispense Refill  . acetaminophen (TYLENOL) 325 MG tablet Take 650 mg by mouth every 6 (six) hours as needed for mild pain.    . cetirizine (ZYRTEC) 10 MG tablet Take 10 mg by mouth daily as needed for allergies.    . diphenhydrAMINE HCl (BENADRYL ALLERGY PO) Take 1 tablet by mouth as needed (allergies).     . Glucosamine-Chondroit-Vit C-Mn (GLUCOSAMINE 1500 COMPLEX PO) Take 1 capsule by mouth daily.    Marland Kitchen ibuprofen (ADVIL,MOTRIN) 200 MG tablet Take 400 mg by mouth every 6 (six) hours as needed for mild pain.    . multivitamin (ONE-A-DAY MEN'S) TABS tablet Take 1 tablet by mouth daily.     No current facility-administered medications for this visit.     Past Medical History:  Diagnosis Date  . Abnormal EKG 08/14/2018  . Asthma   . Chronic lower back pain    "herniated disc between L3-4" (08/14/2018)  . GERD (gastroesophageal reflux disease)   . 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 History of kidney stones   . 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 Left bundle branch block 10/28/2012  . Obesity (BMI 30.0-34.9) 01/22/2014  . Other and unspecified hyperlipidemia 10/29/2012  . Shortness of breath   . Stress at home    "related to custody" (08/14/2018)    ROS:   All systems reviewed and negative except as noted in the HPI.   Past Surgical History:  Procedure Laterality Date  . LEFT HEART CATH AND CORONARY  ANGIOGRAPHY N/A 08/15/2018   Procedure: LEFT HEART CATH AND CORONARY ANGIOGRAPHY;  Surgeon: Yvonne Kendall, MD;  Location: [facility];  Service: Cardiovascular;  Laterality: N/A;  . PACEMAKER IMPLANT N/A 08/15/2018   Medtronic Azure XT MRI conditional dual-chamber pacemaker with 3830 His Bundle lead implanted by Dr Brandon Ramirez for symptomatic  mobitz II second degree AV block     No family history on file.   Social History   Socioeconomic History  . Marital status: Divorced    Spouse name: Not on file  . Number of children: Not on file  . Years of education: Not on file  . Highest education level: Not on file  Occupational History  . Not on file  Tobacco Use  . Smoking status: Never Smoker  . Smokeless tobacco: Never Used  Vaping Use  . Vaping Use: Never used  Substance and Sexual Activity  . Alcohol use: Yes    Alcohol/week: 1.0 standard drink    Types: 1 Glasses of wine per week  . Drug use: Never  . Sexual activity: Not Currently  Other Topics Concern  . Not on file  Social History Narrative  . Not on file   Social Determinants of Health   Financial Resource Strain: Not on file  Food Insecurity: Not on file  Transportation Needs: Not on file  Physical Activity: Not on file  Stress: Not on  Not on file  Food Insecurity: Not on file  Transportation Needs: Not on file  Physical Activity: Not on file  Stress: Not on file  Social Connections: Not on file  Intimate Partner Violence: Not on file     BP 122/84   Pulse 81   Ht 5' 9" (1.753 m)   Wt 223 lb (101.2 kg)   SpO2 96%   BMI 32.93 kg/m   Physical Exam:  Well appearing middle aged man, NAD HEENT: Unremarkable Neck:  No JVD, no thyromegally Lymphatics:  No adenopathy Back:  No CVA tenderness Lungs:  Clear with no wheezes HEART:  Regular rate rhythm, no murmurs, no rubs, no clicks Abd:  soft, positive bowel sounds, no organomegally, no rebound, no guarding Ext:  2 plus pulses, no edema, no cyanosis, no clubbing Skin:  No rashes no  nodules Neuro:  CN II through XII intact, motor grossly intact  EKG - NSR with ventricular pacign  DEVICE  Normal device function.  See PaceArt for details. His bundle threshold is 1.75 at 1.   Assess/Plan: 1. CHB - he is asymptomatic, s/p PPM insertion. No escape today. 2. PPM - he has had progressive increases in his pacing threshold. As he is both young and dependent, it is reasonable to proceed with insertion of a new lead and removal of the old lead. As his lead has been in place over 2 years, this will have to take place in the OR per protocol. I have discussed the indications/risks/benefits/goals/expectations and he wishes to proceed.  Leenah Seidner,MD  

## 2020-12-16 NOTE — Patient Instructions (Addendum)
Medication Instructions:  Your physician recommends that you continue on your current medications as directed. Please refer to the Current Medication list given to you today.  Labwork: None ordered.  Testing/Procedures: None ordered.  Follow-Up: You will be scheduled for a RV lead extraction/reinsertion and pacemaker generator change.  I will call you with further details.  Any Other Special Instructions Will Be Listed Below (If Applicable).  If you need a refill on your cardiac medications before your next appointment, please call your pharmacy.

## 2020-12-21 ENCOUNTER — Telehealth: Payer: Self-pay

## 2020-12-21 DIAGNOSIS — I4430 Unspecified atrioventricular block: Secondary | ICD-10-CM

## 2020-12-21 DIAGNOSIS — T82110A Breakdown (mechanical) of cardiac electrode, initial encounter: Secondary | ICD-10-CM

## 2020-12-21 NOTE — Telephone Encounter (Signed)
Outreach made to Pt.  Pt will be scheduled for RV lead extraction and reinsertion with pacemaker generator change on January 13, 2021.  Labs/covid test scheduled for March 17  Will meet with Pt at that time for instruction letter  Work up complete

## 2021-01-12 ENCOUNTER — Encounter (HOSPITAL_COMMUNITY): Payer: Self-pay | Admitting: Internal Medicine

## 2021-01-12 ENCOUNTER — Other Ambulatory Visit: Payer: 59 | Admitting: *Deleted

## 2021-01-12 ENCOUNTER — Other Ambulatory Visit: Payer: Self-pay

## 2021-01-12 ENCOUNTER — Other Ambulatory Visit (HOSPITAL_COMMUNITY)
Admission: RE | Admit: 2021-01-12 | Discharge: 2021-01-12 | Disposition: A | Discharge status: Home / Self Care | Payer: 59 | Source: Ambulatory Visit | Attending: Internal Medicine | Admitting: Internal Medicine

## 2021-01-12 DIAGNOSIS — I442 Atrioventricular block, complete: Secondary | ICD-10-CM | POA: Diagnosis not present

## 2021-01-12 DIAGNOSIS — Z01812 Encounter for preprocedural laboratory examination: Secondary | ICD-10-CM | POA: Insufficient documentation

## 2021-01-12 DIAGNOSIS — I4430 Unspecified atrioventricular block: Secondary | ICD-10-CM

## 2021-01-12 DIAGNOSIS — Z791 Long term (current) use of non-steroidal anti-inflammatories (NSAID): Secondary | ICD-10-CM | POA: Diagnosis not present

## 2021-01-12 DIAGNOSIS — T82190S Other mechanical complication of cardiac electrode, sequela: Secondary | ICD-10-CM | POA: Diagnosis not present

## 2021-01-12 DIAGNOSIS — Z79899 Other long term (current) drug therapy: Secondary | ICD-10-CM | POA: Diagnosis not present

## 2021-01-12 DIAGNOSIS — T82110A Breakdown (mechanical) of cardiac electrode, initial encounter: Secondary | ICD-10-CM

## 2021-01-12 DIAGNOSIS — Z20822 Contact with and (suspected) exposure to covid-19: Secondary | ICD-10-CM | POA: Diagnosis not present

## 2021-01-12 LAB — BASIC METABOLIC PANEL
BUN/Creatinine Ratio: 17 (ref 9–20)
BUN: 24 mg/dL (ref 6–24)
CO2: 22 mmol/L (ref 20–29)
Calcium: 9.1 mg/dL (ref 8.7–10.2)
Chloride: 101 mmol/L (ref 96–106)
Creatinine, Ser: 1.41 mg/dL — ABNORMAL HIGH (ref 0.76–1.27)
Glucose: 147 mg/dL — ABNORMAL HIGH (ref 65–99)
Potassium: 4.5 mmol/L (ref 3.5–5.2)
Sodium: 140 mmol/L (ref 134–144)
eGFR: 57 mL/min/{1.73_m2} — ABNORMAL LOW (ref >59)

## 2021-01-12 LAB — CBC WITH DIFFERENTIAL/PLATELET
Basophils Absolute: 0.1 10*3/uL (ref 0.0–0.2)
Basos: 1 %
EOS (ABSOLUTE): 0.2 10*3/uL (ref 0.0–0.4)
Eos: 2 %
Hematocrit: 42.4 % (ref 37.5–51.0)
Hemoglobin: 14.4 g/dL (ref 13.0–17.7)
Immature Grans (Abs): 0 10*3/uL (ref 0.0–0.1)
Immature Granulocytes: 0 %
Lymphocytes Absolute: 1.5 10*3/uL (ref 0.7–3.1)
Lymphs: 21 %
MCH: 28.5 pg (ref 26.6–33.0)
MCHC: 34 g/dL (ref 31.5–35.7)
MCV: 84 fL (ref 79–97)
Monocytes Absolute: 0.6 10*3/uL (ref 0.1–0.9)
Monocytes: 8 %
Neutrophils Absolute: 4.9 10*3/uL (ref 1.4–7.0)
Neutrophils: 68 %
Platelets: 223 10*3/uL (ref 150–450)
RBC: 5.05 x10E6/uL (ref 4.14–5.80)
RDW: 12.5 % (ref 11.6–15.4)
WBC: 7.2 10*3/uL (ref 3.4–10.8)

## 2021-01-12 LAB — SARS CORONAVIRUS 2 (TAT 6-24 HRS): SARS Coronavirus 2: NEGATIVE

## 2021-01-12 NOTE — Progress Notes (Signed)
Brandon Ramirez denies chest pain or shortness of breath. Patient was tested for Covid and has been in quarantine since that time.

## 2021-01-13 ENCOUNTER — Encounter (HOSPITAL_COMMUNITY)
Admission: AD | Disposition: A | Discharge status: Home / Self Care | Payer: Self-pay | Source: Home / Self Care | Attending: Internal Medicine

## 2021-01-13 ENCOUNTER — Ambulatory Visit (HOSPITAL_COMMUNITY): Payer: 59 | Admitting: Anesthesiology

## 2021-01-13 ENCOUNTER — Ambulatory Visit (HOSPITAL_COMMUNITY): Payer: 59

## 2021-01-13 ENCOUNTER — Other Ambulatory Visit: Payer: Self-pay

## 2021-01-13 ENCOUNTER — Encounter (HOSPITAL_COMMUNITY): Payer: Self-pay | Admitting: Internal Medicine

## 2021-01-13 ENCOUNTER — Observation Stay (HOSPITAL_COMMUNITY)
Admission: AD | Admit: 2021-01-13 | Discharge: 2021-01-14 | DRG: 243 | Disposition: A | Discharge status: Home / Self Care | Payer: 59 | Attending: Internal Medicine | Admitting: Internal Medicine

## 2021-01-13 DIAGNOSIS — T82110D Breakdown (mechanical) of cardiac electrode, subsequent encounter: Secondary | ICD-10-CM | POA: Diagnosis not present

## 2021-01-13 DIAGNOSIS — Z20822 Contact with and (suspected) exposure to covid-19: Secondary | ICD-10-CM | POA: Diagnosis present

## 2021-01-13 DIAGNOSIS — M5116 Intervertebral disc disorders with radiculopathy, lumbar region: Secondary | ICD-10-CM | POA: Diagnosis present

## 2021-01-13 DIAGNOSIS — E785 Hyperlipidemia, unspecified: Secondary | ICD-10-CM | POA: Diagnosis present

## 2021-01-13 DIAGNOSIS — I442 Atrioventricular block, complete: Secondary | ICD-10-CM | POA: Diagnosis not present

## 2021-01-13 DIAGNOSIS — K219 Gastro-esophageal reflux disease without esophagitis: Secondary | ICD-10-CM | POA: Diagnosis present

## 2021-01-13 DIAGNOSIS — Z79899 Other long term (current) drug therapy: Secondary | ICD-10-CM

## 2021-01-13 DIAGNOSIS — T82111A Breakdown (mechanical) of cardiac pulse generator (battery), initial encounter: Secondary | ICD-10-CM

## 2021-01-13 DIAGNOSIS — T82190S Other mechanical complication of cardiac electrode, sequela: Secondary | ICD-10-CM | POA: Diagnosis not present

## 2021-01-13 DIAGNOSIS — Z791 Long term (current) use of non-steroidal anti-inflammatories (NSAID): Secondary | ICD-10-CM | POA: Insufficient documentation

## 2021-01-13 DIAGNOSIS — T82110S Breakdown (mechanical) of cardiac electrode, sequela: Secondary | ICD-10-CM | POA: Diagnosis not present

## 2021-01-13 DIAGNOSIS — I447 Left bundle-branch block, unspecified: Secondary | ICD-10-CM | POA: Diagnosis present

## 2021-01-13 DIAGNOSIS — Z950 Presence of cardiac pacemaker: Secondary | ICD-10-CM

## 2021-01-13 DIAGNOSIS — G8929 Other chronic pain: Secondary | ICD-10-CM | POA: Diagnosis present

## 2021-01-13 DIAGNOSIS — Z91013 Allergy to seafood: Secondary | ICD-10-CM

## 2021-01-13 DIAGNOSIS — T82190A Other mechanical complication of cardiac electrode, initial encounter: Principal | ICD-10-CM | POA: Diagnosis present

## 2021-01-13 DIAGNOSIS — Y712 Prosthetic and other implants, materials and accessory cardiovascular devices associated with adverse incidents: Secondary | ICD-10-CM | POA: Diagnosis present

## 2021-01-13 HISTORY — DX: Other seasonal allergic rhinitis: J30.2

## 2021-01-13 HISTORY — DX: Presence of cardiac pacemaker: Z95.0

## 2021-01-13 HISTORY — DX: Headache, unspecified: R51.9

## 2021-01-13 HISTORY — DX: Depression, unspecified: F32.A

## 2021-01-13 HISTORY — PX: PACEMAKER LEAD REMOVAL: SHX5064

## 2021-01-13 LAB — PREPARE RBC (CROSSMATCH)

## 2021-01-13 LAB — SURGICAL PCR SCREEN
MRSA, PCR: NEGATIVE
Staphylococcus aureus: POSITIVE — AB

## 2021-01-13 LAB — ECHO INTRAOPERATIVE TEE
Height: 69 in
Weight: 3520 oz

## 2021-01-13 LAB — ABO/RH: ABO/RH(D): O POS

## 2021-01-13 SURGERY — REMOVAL, ELECTRODE LEAD, CARDIAC PACEMAKER, WITHOUT REPLACEMENT
Anesthesia: General | Site: Chest | Laterality: Left

## 2021-01-13 MED ORDER — ESMOLOL HCL 100 MG/10ML IV SOLN
INTRAVENOUS | Status: DC | PRN
  Administered 2021-01-13: 40 mg via INTRAVENOUS

## 2021-01-13 MED ORDER — SODIUM CHLORIDE 0.9% IV SOLUTION: Freq: Once | INTRAVENOUS | Status: DC

## 2021-01-13 MED ORDER — CHLORHEXIDINE GLUCONATE 4 % EX LIQD: 4.0000 "application " | Freq: Once | CUTANEOUS | Status: DC

## 2021-01-13 MED ORDER — FAMOTIDINE IN NACL 20-0.9 MG/50ML-% IV SOLN
INTRAVENOUS | Status: AC
  Filled 2021-01-13: qty 50

## 2021-01-13 MED ORDER — ROCURONIUM BROMIDE 10 MG/ML (PF) SYRINGE
PREFILLED_SYRINGE | INTRAVENOUS | Status: DC | PRN
  Administered 2021-01-13: 70 mg via INTRAVENOUS
  Administered 2021-01-13: 30 mg via INTRAVENOUS

## 2021-01-13 MED ORDER — LACTATED RINGERS IV SOLN: INTRAVENOUS | Status: DC

## 2021-01-13 MED ORDER — DEXAMETHASONE SODIUM PHOSPHATE 10 MG/ML IJ SOLN
INTRAMUSCULAR | Status: DC | PRN
  Administered 2021-01-13: 10 mg via INTRAVENOUS

## 2021-01-13 MED ORDER — ALBUMIN HUMAN 5 % IV SOLN: INTRAVENOUS | Status: DC | PRN

## 2021-01-13 MED ORDER — CEFAZOLIN SODIUM-DEXTROSE 2-4 GM/100ML-% IV SOLN
2.0000 g | INTRAVENOUS | Status: AC
  Administered 2021-01-13: 2 g via INTRAVENOUS
  Filled 2021-01-13: qty 100

## 2021-01-13 MED ORDER — PHENYLEPHRINE HCL-NACL 10-0.9 MG/250ML-% IV SOLN
INTRAVENOUS | Status: DC | PRN
  Administered 2021-01-13: 60 ug/min via INTRAVENOUS

## 2021-01-13 MED ORDER — MIDAZOLAM HCL 2 MG/2ML IJ SOLN
INTRAMUSCULAR | Status: DC | PRN
  Administered 2021-01-13: 2 mg via INTRAVENOUS

## 2021-01-13 MED ORDER — CHLORHEXIDINE GLUCONATE 0.12 % MT SOLN
15.0000 mL | Freq: Once | OROMUCOSAL | Status: AC
  Administered 2021-01-13: 15 mL via OROMUCOSAL
  Filled 2021-01-13: qty 15

## 2021-01-13 MED ORDER — SUGAMMADEX SODIUM 200 MG/2ML IV SOLN
INTRAVENOUS | Status: DC | PRN
  Administered 2021-01-13: 200 mg via INTRAVENOUS

## 2021-01-13 MED ORDER — FENTANYL CITRATE (PF) 250 MCG/5ML IJ SOLN
INTRAMUSCULAR | Status: AC
  Filled 2021-01-13: qty 5

## 2021-01-13 MED ORDER — MUPIROCIN 2 % EX OINT
1.0000 "application " | TOPICAL_OINTMENT | Freq: Once | CUTANEOUS | Status: DC
  Filled 2021-01-13: qty 22

## 2021-01-13 MED ORDER — ACETAMINOPHEN 325 MG PO TABS
325.0000 mg | ORAL_TABLET | ORAL | Status: DC | PRN
Start: 2021-01-13 — End: 2021-01-14

## 2021-01-13 MED ORDER — SODIUM CHLORIDE 0.9 % IV SOLN
INTRAVENOUS | Status: DC | PRN
  Administered 2021-01-13: 500 mL

## 2021-01-13 MED ORDER — ORAL CARE MOUTH RINSE: 15.0000 mL | Freq: Once | OROMUCOSAL | Status: AC

## 2021-01-13 MED ORDER — DIPHENHYDRAMINE HCL 50 MG/ML IJ SOLN
INTRAMUSCULAR | Status: DC | PRN
Start: 2021-01-13 — End: 2021-01-13
  Administered 2021-01-13 (×2): 25 mg via INTRAVENOUS

## 2021-01-13 MED ORDER — CEFAZOLIN SODIUM-DEXTROSE 2-4 GM/100ML-% IV SOLN
2.0000 g | Freq: Four times a day (QID) | INTRAVENOUS | Status: DC
  Filled 2021-01-13 (×2): qty 100

## 2021-01-13 MED ORDER — PHENYLEPHRINE HCL (PRESSORS) 10 MG/ML IV SOLN
INTRAVENOUS | Status: DC | PRN
  Administered 2021-01-13: 80 ug via INTRAVENOUS

## 2021-01-13 MED ORDER — SODIUM CHLORIDE 0.9 % IV SOLN
INTRAVENOUS | Status: AC
  Filled 2021-01-13: qty 1.2

## 2021-01-13 MED ORDER — SODIUM CHLORIDE 0.9 % IV SOLN
INTRAVENOUS | Status: AC
  Filled 2021-01-13 (×2): qty 2

## 2021-01-13 MED ORDER — ONDANSETRON HCL 4 MG/2ML IJ SOLN
INTRAMUSCULAR | Status: DC | PRN
  Administered 2021-01-13: 4 mg via INTRAVENOUS

## 2021-01-13 MED ORDER — MIDAZOLAM HCL 2 MG/2ML IJ SOLN
INTRAMUSCULAR | Status: AC
  Filled 2021-01-13: qty 2

## 2021-01-13 MED ORDER — ONDANSETRON HCL 4 MG/2ML IJ SOLN: 4.0000 mg | Freq: Four times a day (QID) | INTRAMUSCULAR | Status: DC | PRN

## 2021-01-13 MED ORDER — SODIUM CHLORIDE 0.9 % IV SOLN: INTRAVENOUS | Status: DC

## 2021-01-13 MED ORDER — PROPOFOL 10 MG/ML IV BOLUS
INTRAVENOUS | Status: DC | PRN
  Administered 2021-01-13: 150 mg via INTRAVENOUS

## 2021-01-13 MED ORDER — PROPOFOL 10 MG/ML IV BOLUS
INTRAVENOUS | Status: AC
  Filled 2021-01-13: qty 20

## 2021-01-13 MED ORDER — SODIUM CHLORIDE 0.9 % IV SOLN: 80.0000 mg | INTRAVENOUS | Status: DC

## 2021-01-13 MED ORDER — FENTANYL CITRATE (PF) 250 MCG/5ML IJ SOLN
INTRAMUSCULAR | Status: DC | PRN
  Administered 2021-01-13 (×5): 50 ug via INTRAVENOUS

## 2021-01-13 MED ORDER — LACTATED RINGERS IV SOLN: INTRAVENOUS | Status: DC | PRN

## 2021-01-13 MED ORDER — HYDROCORTISONE NA SUCCINATE PF 100 MG IJ SOLR
INTRAMUSCULAR | Status: DC | PRN
  Administered 2021-01-13: 125 mg via INTRAVENOUS

## 2021-01-13 MED ORDER — SODIUM CHLORIDE 0.9 % IV SOLN
INTRAVENOUS | Status: DC | PRN
  Administered 2021-01-13 – 2021-01-14 (×2): 250 mL via INTRAVENOUS

## 2021-01-13 MED ORDER — CEFAZOLIN SODIUM-DEXTROSE 2-4 GM/100ML-% IV SOLN
2.0000 g | Freq: Four times a day (QID) | INTRAVENOUS | Status: AC
  Administered 2021-01-13 – 2021-01-14 (×3): 2 g via INTRAVENOUS
  Filled 2021-01-13 (×3): qty 100

## 2021-01-13 MED ORDER — LIDOCAINE 2% (20 MG/ML) 5 ML SYRINGE
INTRAMUSCULAR | Status: DC | PRN
  Administered 2021-01-13: 40 mg via INTRAVENOUS

## 2021-01-13 SURGICAL SUPPLY — 44 items
BAG BANDED W/RUBBER/TAPE 36X54 (MISCELLANEOUS) ×2 IMPLANT
BLADE OSCILLATING /SAGITTAL (BLADE) IMPLANT
BLADE STERNUM SYSTEM 6 (BLADE) ×2 IMPLANT
BNDG COHESIVE 4X5 WHT NS (GAUZE/BANDAGES/DRESSINGS) IMPLANT
CABLE ADAPT PACING TEMP 12FT (ADAPTER) ×2 IMPLANT
CANISTER SUCT 3000ML PPV (MISCELLANEOUS) ×2 IMPLANT
CANNULA OPTISITE PERFUSION 16F (CANNULA) ×2 IMPLANT
CATH S G BIP PACING (CATHETERS) ×2 IMPLANT
COVER BACK TABLE 60X90IN (DRAPES) ×2 IMPLANT
COVER WAND RF STERILE (DRAPES) ×2 IMPLANT
DRAPE CARDIOVASCULAR INCISE (DRAPES) ×1
DRAPE SRG 135X102X78XABS (DRAPES) ×1 IMPLANT
DRSG OPSITE 6X11 MED (GAUZE/BANDAGES/DRESSINGS) IMPLANT
DRSG TEGADERM 4X4.75 (GAUZE/BANDAGES/DRESSINGS) ×4 IMPLANT
ELECT REM PT RETURN 9FT ADLT (ELECTROSURGICAL) ×4
ELECTRODE REM PT RTRN 9FT ADLT (ELECTROSURGICAL) ×2 IMPLANT
EXTENDER BULLDOG LEAD (MISCELLANEOUS) ×2 IMPLANT
GAUZE 4X4 16PLY RFD (DISPOSABLE) ×6 IMPLANT
GAUZE SPONGE 4X4 12PLY STRL (GAUZE/BANDAGES/DRESSINGS) ×4 IMPLANT
GLOVE BIOGEL PI IND STRL 7.5 (GLOVE) ×1 IMPLANT
GLOVE BIOGEL PI INDICATOR 7.5 (GLOVE) ×1
GLOVE ECLIPSE 8.0 STRL XLNG CF (GLOVE) ×2 IMPLANT
GLOVE SURG UNDER POLY LF SZ7 (GLOVE) ×2 IMPLANT
GOWN STRL REUS W/ TWL LRG LVL3 (GOWN DISPOSABLE) ×4 IMPLANT
GOWN STRL REUS W/ TWL XL LVL3 (GOWN DISPOSABLE) ×2 IMPLANT
GOWN STRL REUS W/TWL LRG LVL3 (GOWN DISPOSABLE) ×4
GOWN STRL REUS W/TWL XL LVL3 (GOWN DISPOSABLE) ×2
IPG PACE AZUR XT DR MRI W1DR01 (Pacemaker) ×1 IMPLANT
KIT TURNOVER KIT B (KITS) ×2 IMPLANT
KIT WRENCH (KITS) ×2 IMPLANT
LEAD CAPSURE NOVUS 5076-58CM (Lead) ×2 IMPLANT
PACE AZURE XT DR MRI W1DR01 (Pacemaker) ×2 IMPLANT
PAD ARMBOARD 7.5X6 YLW CONV (MISCELLANEOUS) ×4 IMPLANT
PAD ELECT DEFIB RADIOL ZOLL (MISCELLANEOUS) ×2 IMPLANT
POUCH AIGIS-R ANTIBACT ICD (Mesh General) ×2 IMPLANT
SHEATH 7FR PRELUDE SNAP 13 (SHEATH) ×2 IMPLANT
SHEATH EVOLUTION SHORTIE RL 9F (SHEATH) ×2 IMPLANT
SUT PROLENE 2 0 SH DA (SUTURE) IMPLANT
TAPE PAPER 3X10 WHT MICROPORE (GAUZE/BANDAGES/DRESSINGS) ×2 IMPLANT
TOWEL GREEN STERILE (TOWEL DISPOSABLE) ×4 IMPLANT
TOWEL GREEN STERILE FF (TOWEL DISPOSABLE) ×8 IMPLANT
TRAY FOLEY SLVR 16FR TEMP STAT (SET/KITS/TRAYS/PACK) ×2 IMPLANT
TUBE CONNECTING 12X1/4 (SUCTIONS) ×2 IMPLANT
YANKAUER SUCT BULB TIP NO VENT (SUCTIONS) ×2 IMPLANT

## 2021-01-13 NOTE — Op Note (Addendum)
EP procedure note  Procedure performed: Extraction of a single ventricular pacing lead which had failed in a patient with complete heart block status post pacemaker insertion utilizing a temporary pacemaker via the right femoral vein  Preoperative diagnosis: Complete heart block status post pacemaker with the current ventricular lead with high output failure  Postoperative diagnosis: Same as preoperative diagnosis  Description of the procedure: After informed consent was obtained, the patient was taken to the operating room in the fasting state.  The anesthesia service was utilized to provide general endotracheal anesthesia and invasive arterial monitoring with a right radial artery catheter.  Transesophageal echocardiography was also applied by the anesthesia service.  After the appropriate timeouts and the typical preparation and draping was carried out, a right femoral venous sheath was placed in the right femoral vein.  Attention was then turned to the pacemaker pocket.  A 5 cm incision was carried out.  Electrocautery was utilized to dissect down to the pacemaker generator which was removed with gentle traction.  There was dense fibrous scar tissue present.  Electrocautery was utilized to free up the pacing leads.  The ventricular lead was damaged in doing so and the patient had transient loss of capture.  We reprogrammed things unipolar and capture ensued.  At this point a temporary transvenous pacemaker was inserted percutaneously through the right femoral vein and advanced into the right ventricle for backup pacing.    We then proceeded with removal of the broken RV pacing lead. A Bulldog lead extender was used to grasp the lead. The Cook 9  french short RL sheath was advanced over the lead and advanced to the innominate vein. The Cook 9 french RL sheath was then advanced over the lead and a combination of traction and counter traction, pressure and counter pressure was applied to the lead and it  was removed in total.  There was no hemodynamic sequelae.   Attention was then turned to placement of a new ventricular pacing lead.  The left subclavian vein was punctured and the Glidewire was advanced under fluoroscopic guidance into the right atrium.  A 7 French sheath followed by a active-fixation Medtronic 5076, 58 cm pacing lead, serial number [id number], was advanced under fluoroscopic guidance into the right ventricle and placed at the right ventricular apical septum.  R waves were 5 paced.  There was a nice injury current present.  The pacing impedance was 700 ohms.  The threshold was 0.5 V at 0.4 ms.  The lead was secured to the fascia with silk suture.  The sewing sleeve was secured with silk suture.  Electrocautery was utilized to assure hemostasis.  The new Medtronic dual-chamber pacemaker, serial [id number], was connected to the new right ventricular and old right atrial pacing leads.  The atrial lead function satisfactorily.  The pocket was irrigated with antibiotic.  And antibiotic pouch was placed over the lead and the can.  The new device was placed in the subcutaneous pocket.  The pocket was irrigated with antibiotic irrigation.  The incision was closed with 2 layers of Vicryl suture.  Benzoin and Steri-Strips were pain on the skin.  The temporary pacemaker was removed under fluoroscopic guidance and did not dislodge the leads.  The right femoral venous sheath was removed and hemostasis obtained.  The patient was returned to the recovery area for ongoing management and treatment.   Complications: There were no immediate procedure complications  Conclusion: Successful insertion of a new right ventricular pacing lead, extraction of a  60-year-old right ventricular His bundle pacing lead, removal of the previously implanted dual-chamber pacemaker which was less than a year from ERI, insertion of a new dual-chamber pacemaker, and insertion of a temporary PM.  Dr. Lalla Brothers worked with me  as an Geophysicist/field seismologist during the procedure.  Sharlot Gowda Halo Laski,MD

## 2021-01-13 NOTE — Anesthesia Preprocedure Evaluation (Addendum)
Anesthesia Evaluation  Patient identified by MRN, date of birth, ID band Patient awake    Reviewed: Allergy & Precautions, NPO status , Patient's Chart, lab work & pertinent test results  Airway Mallampati: II  TM Distance: >3 FB Neck ROM: Full    Dental  (+) Teeth Intact, Dental Advisory Given   Pulmonary asthma ,    breath sounds clear to auscultation       Cardiovascular + dysrhythmias + pacemaker  Rhythm:Regular Rate:Normal     Neuro/Psych  Headaches, PSYCHIATRIC DISORDERS Depression  Neuromuscular disease    GI/Hepatic Neg liver ROS, GERD  ,  Endo/Other  negative endocrine ROS  Renal/GU negative Renal ROS     Musculoskeletal negative musculoskeletal ROS (+)   Abdominal Normal abdominal exam  (+)   Peds  Hematology negative hematology ROS (+)   Anesthesia Other Findings   Reproductive/Obstetrics                            Anesthesia Physical Anesthesia Plan  ASA: III  Anesthesia Plan: General   Post-op Pain Management:    Induction: Intravenous  PONV Risk Score and Plan: 3 and Ondansetron, Dexamethasone and Midazolam  Airway Management Planned: Oral ETT  Additional Equipment: Arterial line  Intra-op Plan:   Post-operative Plan: Extubation in OR  Informed Consent: I have reviewed the patients History and Physical, chart, labs and discussed the procedure including the risks, benefits and alternatives for the proposed anesthesia with the patient or authorized representative who has indicated his/her understanding and acceptance.     Dental advisory given  Plan Discussed with: CRNA  Anesthesia Plan Comments: (- 2 IV's)       Anesthesia Quick Evaluation

## 2021-01-13 NOTE — Anesthesia Procedure Notes (Signed)
Procedure Name: Intubation Date/Time: 01/13/2021 3:12 PM Performed by: Clearnce Sorrel, CRNA Pre-anesthesia Checklist: Patient identified, Emergency Drugs available, Suction available and Patient being monitored Patient Re-evaluated:Patient Re-evaluated prior to induction Oxygen Delivery Method: Circle system utilized Preoxygenation: Pre-oxygenation with 100% oxygen Induction Type: IV induction Ventilation: Mask ventilation without difficulty Laryngoscope Size: Mac and 4 Grade View: Grade I Tube type: Oral Tube size: 7.5 mm Number of attempts: 1 Airway Equipment and Method: Stylet and Oral airway Placement Confirmation: ETT inserted through vocal cords under direct vision,  positive ETCO2 and breath sounds checked- equal and bilateral Secured at: 22 cm Tube secured with: Tape Dental Injury: Teeth and Oropharynx as per pre-operative assessment

## 2021-01-13 NOTE — Transfer of Care (Signed)
Immediate Anesthesia Transfer of Care Note  Patient: Brandon Ramirez  Procedure(s) Performed: MEDTRONIC PACEMAKER GENERATOR CHANGE WITH RV LEAD EXTRACTON AND REINSERTION DR ADKINS BACKUP (Left Chest)  Patient Location: PACU  Anesthesia Type:General  Level of Consciousness: awake, alert  and oriented  Airway & Oxygen Therapy: Patient Spontanous Breathing and Patient connected to face mask oxygen  Post-op Assessment: Post -op Vital signs reviewed and stable  Post vital signs: Reviewed and stable  Last Vitals:  Vitals Value Taken Time  BP 141/76 01/13/21 1736  Temp    Pulse 77 01/13/21 1739  Resp 18 01/13/21 1739  SpO2 100 % 01/13/21 1739  Vitals shown include unvalidated device data.  Last Pain:  Vitals:   01/13/21 1406  TempSrc:   PainSc: 0-No pain      Patients Stated Pain Goal: 3 (01/13/21 1406)  Complications: No complications documented.

## 2021-01-13 NOTE — Progress Notes (Signed)
Pt admitted from pacu sp pacer and lead revision. Pt vss. No acute distress noted. Pt on RA. Neuro intact. groin site and pacer site covered and soft. Call bell with in reach. No family in room. Report received. Will assume poc.

## 2021-01-13 NOTE — Discharge Instructions (Signed)
    Supplemental Discharge Instructions for  Pacemaker/Defibrillator Patients    Activity No heavy lifting or vigorous activity with your left/right arm for 6 to 8 weeks.  Do not raise your left/right arm above your head for one week.  Gradually raise your affected arm as drawn below.             01/18/21                     01/19/21                    01/20/21                   01/21/21 __  NO DRIVING until cleared to at your wound check visit.  WOUND CARE - Keep the wound area clean and dry.  Do not get this area wet , no showers until cleared to at your wound check visit . - The tape/steri-strips on your wound will fall off; do not pull them off.  No bandage is needed on the site.  DO  NOT apply any creams, oils, or ointments to the wound area. - If you notice any drainage or discharge from the wound, any swelling or bruising at the site, or you develop a fever > 101? F after you are discharged home, call the office at once.  Special Instructions - You are still able to use cellular telephones; use the ear opposite the side where you have your pacemaker/defibrillator.  Avoid carrying your cellular phone near your device. - When traveling through airports, show security personnel your identification card to avoid being screened in the metal detectors.  Ask the security personnel to use the hand wand. - Avoid arc welding equipment, MRI testing (magnetic resonance imaging), TENS units (transcutaneous nerve stimulators).  Call the office for questions about other devices. - Avoid electrical appliances that are in poor condition or are not properly grounded. - Microwave ovens are safe to be near or to operate.  

## 2021-01-13 NOTE — H&P (Signed)
HPI Mr. Brandon Ramirez is referred by Dr. Fawn Kirk for consideration for PM lead revision. He is a pleasant 60 yo man with CHB. He underwent insertion of a medtronic DDD PM with a His bundle lead in 2019. He has unfortunately developed worsening His bundle lead capture thresholds. He has about a year left on his device and presents to discuss treatment options. He has not had syncope since his device implant. He feels well.      Allergies  Allergen Reactions  . Shellfish Allergy Swelling           Current Outpatient Medications  Medication Sig Dispense Refill  . acetaminophen (TYLENOL) 325 MG tablet Take 650 mg by mouth every 6 (six) hours as needed for mild pain.    . cetirizine (ZYRTEC) 10 MG tablet Take 10 mg by mouth daily as needed for allergies.    . diphenhydrAMINE HCl (BENADRYL ALLERGY PO) Take 1 tablet by mouth as needed (allergies).     . Glucosamine-Chondroit-Vit C-Mn (GLUCOSAMINE 1500 COMPLEX PO) Take 1 capsule by mouth daily.    Marland Kitchen ibuprofen (ADVIL,MOTRIN) 200 MG tablet Take 400 mg by mouth every 6 (six) hours as needed for mild pain.    . multivitamin (ONE-A-DAY MEN'S) TABS tablet Take 1 tablet by mouth daily.     No current facility-administered medications for this visit.         Past Medical History:  Diagnosis Date  . Abnormal EKG 08/14/2018  . Asthma   . Chronic lower back pain    "herniated disc between L3-4" (08/14/2018)  . GERD (gastroesophageal reflux disease)   . 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 History of kidney stones   . 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 Left bundle branch block 10/28/2012  . Obesity (BMI 30.0-34.9) 01/22/2014  . Other and unspecified hyperlipidemia 10/29/2012  . Shortness of breath   . Stress at home    "related to custody" (08/14/2018)    ROS:   All systems reviewed and negative except as noted in the HPI.        Past Surgical History:   Procedure Laterality Date  . LEFT HEART CATH AND CORONARY ANGIOGRAPHY N/A 08/15/2018   Procedure: LEFT HEART CATH AND CORONARY ANGIOGRAPHY;  Surgeon: Yvonne Kendall, MD;  Location: [facility];  Service: Cardiovascular;  Laterality: N/A;  . 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   Social History        Socioeconomic History  . Marital status: Divorced    Spouse name: Not on file  . Number of children: Not on file  . Years of education: Not on file  . Highest education level: Not on file  Occupational History  . Not on file  Tobacco Use  . Smoking status: Never Smoker  . Smokeless tobacco: Never Used  Vaping Use  . Vaping Use: Never used  Substance and Sexual Activity  . Alcohol use: Yes    Alcohol/week: 1.0 standard drink    Types: 1 Glasses of wine per week  . Drug use: Never  . Sexual activity: Not Currently  Other Topics Concern  . Not on file  Social History Narrative  . Not on file   Social Determinants of Health   Financial Resource Strain:  Not on file  Food Insecurity: Not on file  Transportation Needs: Not on file  Physical Activity: Not on file  Stress: Not on file  Social Connections: Not on file  Intimate Partner Violence: Not on file     BP 122/84   Pulse 81   Ht 5\' 9"  (1.753 m)   Wt 223 lb (101.2 kg)   SpO2 96%   BMI 32.93 kg/m   Physical Exam:  Well appearing middle aged man, NAD HEENT: Unremarkable Neck:  No JVD, no thyromegally Lymphatics:  No adenopathy Back:  No CVA tenderness Lungs:  Clear with no wheezes HEART:  Regular rate rhythm, no murmurs, no rubs, no clicks Abd:  soft, positive bowel sounds, no organomegally, no rebound, no guarding Ext:  2 plus pulses, no edema, no cyanosis, no clubbing Skin:  No rashes no  nodules Neuro:  CN II through XII intact, motor grossly intact  EKG - NSR with ventricular pacign  DEVICE  Normal device function.  See PaceArt for details. His bundle threshold is 1.75 at 1.   Assess/Plan: 1. CHB - he is asymptomatic, s/p PPM insertion. No escape today. 2. PPM - he has had progressive increases in his pacing threshold. As he is both young and dependent, it is reasonable to proceed with insertion of a new lead and removal of the old lead. As his lead has been in place over 2 years, this will have to take place in the OR per protocol. I have discussed the indications/risks/benefits/goals/expectations and he wishes to proceed.  Edye Hainline,MD

## 2021-01-14 ENCOUNTER — Encounter (HOSPITAL_COMMUNITY): Payer: Self-pay | Admitting: Internal Medicine

## 2021-01-14 ENCOUNTER — Inpatient Hospital Stay (HOSPITAL_COMMUNITY): Payer: 59

## 2021-01-14 DIAGNOSIS — T82110S Breakdown (mechanical) of cardiac electrode, sequela: Secondary | ICD-10-CM

## 2021-01-14 DIAGNOSIS — T82190S Other mechanical complication of cardiac electrode, sequela: Secondary | ICD-10-CM | POA: Diagnosis not present

## 2021-01-14 NOTE — Progress Notes (Signed)
   Progress Note  Patient Name: Brandon Ramirez Date of Encounter: 01/14/2021  Primary Cardiologist: Kristeen Miss, MD   Subjective   No chest pain or sob.   Inpatient Medications    Scheduled Meds:  Continuous Infusions: . sodium chloride 250 mL (01/14/21 0418)  .  ceFAZolin (ANCEF) IV 2 g (01/14/21 0419)   PRN Meds: sodium chloride, acetaminophen, ondansetron (ZOFRAN) IV   Vital Signs    Vitals:   01/13/21 2020 01/14/21 0030 01/14/21 0500 01/14/21 0845  BP: 112/76 106/66 103/67 120/73  Pulse: 73 81 87 78  Resp: 16 18 18 18   Temp: 97.9 F (36.6 C) 97.7 F (36.5 C) 98.3 F (36.8 C)   TempSrc: Oral Oral Oral   SpO2: 95% 97% 98% 95%  Weight:      Height:        Intake/Output Summary (Last 24 hours) at 01/14/2021 0920 Last data filed at 01/14/2021 0418 Gross per 24 hour  Intake 1350.95 ml  Output 135 ml  Net 1215.95 ml   Filed Weights   01/12/21 1726 01/13/21 1338 01/13/21 1819  Weight: 97.5 kg 99.8 kg 102.5 kg    Telemetry    nsr with ventricular pacing - Personally Reviewed  ECG    none - Personally Reviewed  Physical Exam   GEN: No acute distress.   Neck: No JVD Cardiac: RRR, no murmurs, rubs, or gallops.  Respiratory: Clear to auscultation bilaterally. Incision with no bleeding GI: Soft, nontender, non-distended  MS: No edema; No deformity. Neuro:  Nonfocal  Psych: Normal affect   Labs    Chemistry Recent Labs  Lab 01/12/21 0810  NA 140  K 4.5  CL 101  CO2 22  GLUCOSE 147*  BUN 24  CREATININE 1.41*  CALCIUM 9.1     Hematology Recent Labs  Lab 01/12/21 0810  WBC 7.2  RBC 5.05  HGB 14.4  HCT 42.4  MCV 84  MCH 28.5  MCHC 34.0  RDW 12.5  PLT 223    Cardiac EnzymesNo results for input(s): TROPONINI in the last 168 hours. No results for input(s): TROPIPOC in the last 168 hours.   BNPNo results for input(s): BNP, PROBNP in the last 168 hours.   DDimer No results for input(s): DDIMER in the last 168 hours.   Radiology     No results found.  Cardiac Studies   TEE intraoperative reviewed  Patient Profile     60 y.o. male admitted for PM lead failure and system extraction, s/p insertion of a new DDD PM  Assessment & Plan    1. CHB - he is asymptomatic, s/p PM revision 2. PM lead failure -he had increasing pacing thresholds and underwent insertion of a new PM lead and removal of the old lead with insertion of a new PPM. PM interrogation under my supervison demonstrates normal device function. CXR demonstrates normal lead position. 3. Disp. - he is stable for DC home. Usual followup.   For questions or updates, please contact [facility] Please consult [email or web address] for contact info under Cardiology/STEMI.      Signed, 46, MD  01/14/2021, 9:20 AM  Patient ID: 01/16/2021, male   DOB: [birth year], 60 y.o.   MRN: [id number]

## 2021-01-14 NOTE — Discharge Summary (Addendum)
Discharge Summary    Patient ID: Brandon Ramirez MRN: [id number]; DOB: 1961/05/08  Admit date: 01/13/2021 Discharge date: 01/14/2021  PCP:  Patient, No Pcp Per   [facility]  Cardiologist:  Kristeen Miss, MD  Advanced Practice Provider:  No care team member to display Electrophysiologist:  Lewayne Bunting, MD    Discharge Diagnoses    Active Problems:   Pacemaker lead failure, sequela    Diagnostic Studies/Procedures    Procedure performed: Extraction of a single ventricular pacing lead which had failed in a patient with complete heart block status post pacemaker insertion utilizing a temporary pacemaker via the right femoral vein   Preoperative diagnosis: Complete heart block status post pacemaker with the current ventricular lead with high output failure   Postoperative diagnosis: Same as preoperative diagnosis  Description of the procedure: After informed consent was obtained, the patient was taken to the operating room in the fasting state.  The anesthesia service was utilized to provide general endotracheal anesthesia and invasive arterial monitoring with a right radial artery catheter.  Transesophageal echocardiography was also applied by the anesthesia service.  After the appropriate timeouts and the typical preparation and draping was carried out, a right femoral venous sheath was placed in the right femoral vein.  Attention was then turned to the pacemaker pocket.  A 5 cm incision was carried out.  Electrocautery was utilized to dissect down to the pacemaker generator which was removed with gentle traction.  There was dense fibrous scar tissue present.  Electrocautery was utilized to free up the pacing leads.  The ventricular lead was damaged in doing so and the patient had transient loss of capture.  We reprogrammed things unipolar and capture ensued.  At this point a temporary transvenous pacemaker was inserted percutaneously through the right femoral vein  and advanced into the right ventricle for backup pacing.  Attention was then turned to placement of a new ventricular pacing lead.  The left subclavian vein was punctured and the Glidewire was advanced under fluoroscopic guidance into the right atrium.  A 7 French sheath followed by a active-fixation Medtronic 507 658 cm pacing lead, serial number [id number], was advanced under fluoroscopic guidance into the right ventricle and placed at the right ventricular apical septum.  R waves were 5 paced.  There was a nice injury current present.  The pacing impedance was 700 ohms.  The threshold was 0.5 V at 0.4 ms.  The lead was secured to the fascia with silk suture.  The sewing sleeve was secured with silk suture.  Electrocautery was utilized to assure hemostasis.  The new Medtronic dual-chamber pacemaker, serial [id number], was connected to the new right ventricular and old right atrial pacing leads.  The atrial lead function satisfactorily.  The pocket was irrigated with antibiotic.  And antibiotic pouch was placed over the lead and the can.  The new device was placed in the subcutaneous pocket.  The pocket was irrigated with antibiotic irrigation.  The incision was closed with 2 layers of Vicryl suture.  Benzoin and Steri-Strips were pain on the skin.  The temporary pacemaker was removed under fluoroscopic guidance and did not dislodge the leads.  The right femoral venous sheath was removed and hemostasis obtained.  The patient was returned to the recovery area for ongoing management and treatment.     Complications: There were no immediate procedure complications   Conclusion: Successful insertion of a new right ventricular pacing lead, extraction of a 22-year-old right ventricular His  bundle pacing lead, removal of the previously implanted dual-chamber pacemaker which was less than a year from ERI, insertion of a new dual-chamber pacemaker, and insertion of a temporary PM.   Dr. Lalla Brothers worked with me  as an Geophysicist/field seismologist during the procedure. _____________   History of Present Illness     Brandon Ramirez is a 60 y.o. male with hx of CHB s/p MDT PPM, LBBB, GERD, depression, HLD, asthma.   He was seen in the office with worsening HIS bundle lead capture thresholds.  His device is less than a year from ERI.  He was scheduled for RV lead extraction and generator change out.  Hospital Course     Consultants: None  He came to the hospital on 3/18 for the procedure.  Procedure note as above.  A temporary pacemaker was inserted during the procedure.  He had successful insertion of a new RV pacing lead as well as extraction of the 70-year-old pacing lead.  He had removal of the previously implanted pacemaker and insertion of the new Medtronic pacemaker.  He tolerated the procedure well.  On 3/19, he was seen by Dr. Ladona Ridgel and all data were reviewed.  His device was interrogated and showed normal device function.  His chest x-ray showed normal lead position and no pneumothorax on preliminary evaluation, final read is pending.  No further work-up is indicated he is considered stable for discharge, to follow-up as an outpatient.  Did the patient have an acute coronary syndrome (MI, NSTEMI, STEMI, etc) this admission?:  No                               Did the patient have a percutaneous coronary intervention (stent / angioplasty)?:  No.    _____________  Discharge Vitals Blood pressure 120/73, pulse 78, temperature 98.3 F (36.8 C), temperature source Oral, resp. rate 18, height 5\' 9"  (1.753 m), weight 102.5 kg, SpO2 95 %.  Filed Weights   01/12/21 1726 01/13/21 1338 01/13/21 1819  Weight: 97.5 kg 99.8 kg 102.5 kg    Labs & Radiologic Studies    CBC Recent Labs    01/12/21 0810  WBC 7.2  NEUTROABS 4.9  HGB 14.4  HCT 42.4  MCV 84  PLT 223   Basic Metabolic Panel Recent Labs    01/14/21 0810  NA 140  K 4.5  CL 101  CO2 22  GLUCOSE 147*  BUN 24  CREATININE 1.41*  CALCIUM 9.1    Liver Function Tests No results for input(s): AST, ALT, ALKPHOS, BILITOT, PROT, ALBUMIN in the last 72 hours. No results for input(s): LIPASE, AMYLASE in the last 72 hours. High Sensitivity Troponin:   No results for input(s): TROPONINIHS in the last 720 hours.  BNP Invalid input(s): POCBNP D-Dimer No results for input(s): DDIMER in the last 72 hours. Hemoglobin A1C No results for input(s): HGBA1C in the last 72 hours. Fasting Lipid Panel No results for input(s): CHOL, HDL, LDLCALC, TRIG, CHOLHDL, LDLDIRECT in the last 72 hours. Thyroid Function Tests No results for input(s): TSH, T4TOTAL, T3FREE, THYROIDAB in the last 72 hours.  Invalid input(s): FREET3 _____________  No results found. Disposition   Pt is being discharged home today in good condition.  Follow-up Plans & Appointments     Follow-up Information     [facility] Office Follow up.   Specialty: Cardiology Why: 01/24/21 @ 8:40AM, wound check visit Contact information: [address],  [address] [place] [address] [phone]        Marinus Maw, MD Follow up.   Specialty: Cardiology Why: 04/19/21 @ 8:45AM Contact information: [address] [place] [address] [phone]                Discharge Instructions     Call MD for:  redness, tenderness, or signs of infection (pain, swelling, redness, odor or green/yellow discharge around incision site)   Complete by: As directed    Diet - low sodium heart healthy   Complete by: As directed    Discharge wound care:   Complete by: As directed    Keep pacemaker site clean and dry.  Call for signs of infection.   Increase activity slowly   Complete by: As directed        Discharge Medications   Allergies as of 01/14/2021       Reactions   Shellfish Allergy Swelling        Medication List     TAKE these medications    acetaminophen 500 MG tablet Commonly known as: TYLENOL Take  500 mg by mouth every 6 (six) hours as needed for moderate pain or headache.   calcium carbonate 500 MG chewable tablet Commonly known as: TUMS - dosed in mg elemental calcium Chew 1,000-1,500 mg by mouth daily as needed for indigestion or heartburn.   cetirizine 10 MG tablet Commonly known as: ZYRTEC Take 10 mg by mouth daily as needed for allergies.   diphenhydrAMINE 25 MG tablet Commonly known as: BENADRYL Take 25 mg by mouth every 8 (eight) hours as needed for allergies or sleep.   EPINEPHrine 0.3 mg/0.3 mL Soaj injection Commonly known as: EPI-PEN Inject 0.3 mg into the muscle as needed for anaphylaxis.   ibuprofen 200 MG tablet Commonly known as: ADVIL Take 400 mg by mouth every 6 (six) hours as needed for mild pain.   Melatonin 5 MG Caps Take 5-10 mg by mouth at bedtime as needed (sleep).   multivitamin Tabs tablet Take 1 tablet by mouth daily.   sildenafil 25 MG tablet Commonly known as: VIAGRA Take 25 mg by mouth daily as needed for erectile dysfunction.   ThermaCare Back/Hip Misc Place 1 patch onto the skin daily as needed (pain).   VITAMIN C PO Take 1 tablet by mouth daily as needed (immune support).   Vitamin D 50 MCG (2000 UT) tablet Take 4,000 Units by mouth daily.   ZINC PO Take 1 tablet by mouth daily.               Discharge Care Instructions  (From admission, onward)           Start     Ordered   01/14/21 0000  Discharge wound care:       Comments: Keep pacemaker site clean and dry.  Call for signs of infection.   01/14/21 1007               Outstanding Labs/Studies   CXR   Duration of Discharge Encounter   Greater than 30 minutes including physician time.  Signed, Theodore Demark, PA-C 01/14/2021, 10:07 AM  EP Attending  Patient seen and examined. Agree with the above. The patient is stable for DC. See my note as well. Usual followup as above.  Sharlot Gowda Aisling Emigh,MD

## 2021-01-15 NOTE — Anesthesia Postprocedure Evaluation (Signed)
Anesthesia Post Note  Patient: Brandon Ramirez  Procedure(s) Performed: MEDTRONIC PACEMAKER GENERATOR CHANGE WITH RV LEAD EXTRACTON AND REINSERTION DR ADKINS BACKUP (Left Chest)     Patient location during evaluation: PACU Anesthesia Type: General Level of consciousness: awake and alert Pain management: pain level controlled Vital Signs Assessment: post-procedure vital signs reviewed and stable Respiratory status: spontaneous breathing, nonlabored ventilation, respiratory function stable and patient connected to nasal cannula oxygen Cardiovascular status: blood pressure returned to baseline and stable Postop Assessment: no apparent nausea or vomiting Anesthetic complications: no   No complications documented.  Last Vitals:  Vitals:   01/14/21 0945 01/14/21 1045  BP: 116/64 114/71  Pulse: 84 83  Resp:    Temp:    SpO2: 95% 96%    Last Pain:  Vitals:   01/14/21 0830  TempSrc:   PainSc: 0-No pain                 Earl Lites P Ramses Klecka

## 2021-01-16 LAB — TYPE AND SCREEN
ABO/RH(D): O POS
Antibody Screen: NEGATIVE
Unit division: 0
Unit division: 0

## 2021-01-16 LAB — BPAM RBC
Blood Product Expiration Date: 202204212359
Blood Product Expiration Date: 202204212359
Unit Type and Rh: 5100
Unit Type and Rh: 5100

## 2021-01-17 ENCOUNTER — Encounter (HOSPITAL_COMMUNITY): Payer: Self-pay | Admitting: Internal Medicine

## 2021-01-24 ENCOUNTER — Other Ambulatory Visit: Payer: Self-pay

## 2021-01-24 ENCOUNTER — Ambulatory Visit (INDEPENDENT_AMBULATORY_CARE_PROVIDER_SITE_OTHER): Payer: 59 | Admitting: Emergency Medicine

## 2021-01-24 DIAGNOSIS — I4430 Unspecified atrioventricular block: Secondary | ICD-10-CM

## 2021-01-24 LAB — CUP PACEART INCLINIC DEVICE CHECK
Battery Remaining Longevity: 136 mo
Battery Voltage: 3.21 V
Brady Statistic AP VP Percent: 1.36 %
Brady Statistic AP VS Percent: 0 %
Brady Statistic AS VP Percent: 98.52 %
Brady Statistic AS VS Percent: 0.12 %
Brady Statistic RA Percent Paced: 1.36 %
Brady Statistic RV Percent Paced: 99.88 %
Date Time Interrogation Session: 20220329091135
Implantable Lead Implant Date: 20191018
Implantable Lead Implant Date: 20220318
Implantable Lead Location: 753859
Implantable Lead Location: 753860
Implantable Lead Model: 5076
Implantable Lead Model: 5076
Implantable Pulse Generator Implant Date: 20220318
Lead Channel Impedance Value: 342 Ohm
Lead Channel Impedance Value: 437 Ohm
Lead Channel Impedance Value: 494 Ohm
Lead Channel Impedance Value: 551 Ohm
Lead Channel Pacing Threshold Amplitude: 0.75 V
Lead Channel Pacing Threshold Amplitude: 0.75 V
Lead Channel Pacing Threshold Pulse Width: 0.4 ms
Lead Channel Pacing Threshold Pulse Width: 0.4 ms
Lead Channel Sensing Intrinsic Amplitude: 4.25 mV
Lead Channel Sensing Intrinsic Amplitude: 7.875 mV
Lead Channel Setting Pacing Amplitude: 1.5 V
Lead Channel Setting Pacing Amplitude: 3.5 V
Lead Channel Setting Pacing Pulse Width: 0.4 ms
Lead Channel Setting Sensing Sensitivity: 0.9 mV

## 2021-01-24 NOTE — Progress Notes (Signed)
Wound check appointment. Steri-strips removed. Wound without redness or edema. Incision edges approximated, wound well healed. Normal device function. Thresholds, sensing, and impedances consistent with implant measurements. Device programmed at 3.5V/auto capture programmed on for extra safety margin until 3 month visit in RV. RA lead programmed at chronic settings due to mature lead. Histogram distribution appropriate for patient and level of activity. No mode switches or high ventricular rates noted. Patient educated about wound care, arm mobility, lifting restrictions. ROV with Dr Ladona Ridgel 04/19/21. Enrolled in remote follow-up and next remote 04/12/21.

## 2021-04-06 ENCOUNTER — Ambulatory Visit: Payer: 59 | Admitting: Internal Medicine

## 2021-04-17 ENCOUNTER — Ambulatory Visit (INDEPENDENT_AMBULATORY_CARE_PROVIDER_SITE_OTHER): Payer: PRIVATE HEALTH INSURANCE

## 2021-04-17 DIAGNOSIS — I442 Atrioventricular block, complete: Secondary | ICD-10-CM | POA: Diagnosis not present

## 2021-04-17 LAB — CUP PACEART REMOTE DEVICE CHECK
Battery Remaining Longevity: 153 mo
Battery Voltage: 3.19 V
Brady Statistic AP VP Percent: 2.1 %
Brady Statistic AP VS Percent: 0 %
Brady Statistic AS VP Percent: 97.86 %
Brady Statistic AS VS Percent: 0.04 %
Brady Statistic RA Percent Paced: 2.1 %
Brady Statistic RV Percent Paced: 99.96 %
Date Time Interrogation Session: 20220620035650
Implantable Lead Implant Date: 20191018
Implantable Lead Implant Date: 20220318
Implantable Lead Location: 753859
Implantable Lead Location: 753860
Implantable Lead Model: 5076
Implantable Lead Model: 5076
Implantable Pulse Generator Implant Date: 20220318
Lead Channel Impedance Value: 342 Ohm
Lead Channel Impedance Value: 380 Ohm
Lead Channel Impedance Value: 570 Ohm
Lead Channel Impedance Value: 589 Ohm
Lead Channel Pacing Threshold Amplitude: 0.75 V
Lead Channel Pacing Threshold Amplitude: 0.875 V
Lead Channel Pacing Threshold Pulse Width: 0.4 ms
Lead Channel Pacing Threshold Pulse Width: 0.4 ms
Lead Channel Sensing Intrinsic Amplitude: 2.875 mV
Lead Channel Sensing Intrinsic Amplitude: 2.875 mV
Lead Channel Sensing Intrinsic Amplitude: 9.875 mV
Lead Channel Sensing Intrinsic Amplitude: 9.875 mV
Lead Channel Setting Pacing Amplitude: 1.5 V
Lead Channel Setting Pacing Amplitude: 2 V
Lead Channel Setting Pacing Pulse Width: 0.4 ms
Lead Channel Setting Sensing Sensitivity: 0.9 mV

## 2021-04-19 ENCOUNTER — Other Ambulatory Visit: Payer: Self-pay

## 2021-04-19 ENCOUNTER — Ambulatory Visit (INDEPENDENT_AMBULATORY_CARE_PROVIDER_SITE_OTHER): Payer: Self-pay | Admitting: Internal Medicine

## 2021-04-19 ENCOUNTER — Encounter: Payer: Self-pay | Admitting: Internal Medicine

## 2021-04-19 VITALS — BP 118/82 | HR 85 | Ht 69.0 in | Wt 221.8 lb

## 2021-04-19 DIAGNOSIS — T82110S Breakdown (mechanical) of cardiac electrode, sequela: Secondary | ICD-10-CM

## 2021-04-19 DIAGNOSIS — I4430 Unspecified atrioventricular block: Secondary | ICD-10-CM

## 2021-04-19 DIAGNOSIS — Z950 Presence of cardiac pacemaker: Secondary | ICD-10-CM

## 2021-04-19 NOTE — Progress Notes (Signed)
HPI Brandon Ramirez is referred by Dr. Fawn Kirk for consideration for PM lead revision. He is a pleasant 60 yo man with CHB. He underwent insertion of a medtronic DDD PM with a His bundle lead in 2019. He has unfortunately developed worsening His bundle lead capture thresholds. He underwent PM lead removal and insertion of a new RV lead about 3 months ago. In the interim, he notes that he has returned to his usual activity. He has no limit working in his yard. Allergies  Allergen Reactions   Shellfish Allergy Swelling     Current Outpatient Medications  Medication Sig Dispense Refill   acetaminophen (TYLENOL) 500 MG tablet Take 500 mg by mouth every 6 (six) hours as needed for moderate pain or headache.     Ascorbic Acid (VITAMIN C PO) Take 1 tablet by mouth daily as needed (immune support).     calcium carbonate (TUMS - DOSED IN MG ELEMENTAL CALCIUM) 500 MG chewable tablet Chew 1,000-1,500 mg by mouth daily as needed for indigestion or heartburn.     cetirizine (ZYRTEC) 10 MG tablet Take 10 mg by mouth daily as needed for allergies.     Cholecalciferol (VITAMIN D) 50 MCG (2000 UT) tablet Take 4,000 Units by mouth daily.     diphenhydrAMINE (BENADRYL) 25 MG tablet Take 25 mg by mouth every 8 (eight) hours as needed for allergies or sleep.     EPINEPHrine 0.3 mg/0.3 mL IJ SOAJ injection Inject 0.3 mg into the muscle as needed for anaphylaxis.     Heat Wraps (THERMACARE BACK/HIP) MISC Place 1 patch onto the skin daily as needed (pain).     ibuprofen (ADVIL,MOTRIN) 200 MG tablet Take 400 mg by mouth every 6 (six) hours as needed for mild pain.     Melatonin 5 MG CAPS Take 5-10 mg by mouth at bedtime as needed (sleep).     Multiple Vitamins-Minerals (ZINC PO) Take 1 tablet by mouth daily.     multivitamin (ONE-A-DAY MEN'S) TABS tablet Take 1 tablet by mouth daily.     sildenafil (VIAGRA) 25 MG tablet Take 25 mg by mouth daily as needed for erectile dysfunction.     No current  facility-administered medications for this visit.     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   Social History   Socioeconomic History   Marital status: Divorced    Spouse name: Not on file   Number of children: Not on file   Years of education: Not on file   Highest education level: Not on file  Occupational History   Not on file  Tobacco Use   Smoking status: Never   Smokeless tobacco: Never   Vaping Use   Vaping Use: Never used  Substance and Sexual Activity   Alcohol use: Yes    Alcohol/week: 1.0 standard drink    Types: 1 Glasses of wine per week   Drug use: Never   Sexual activity: Not Currently  Other Topics Concern   Not on file  Social History Narrative   Not on file   Social Determinants of Health   Financial Resource Strain: Not on file  Food Insecurity: Not on file  Transportation Needs: Not on file  Physical Activity: Not on file  Stress: Not on file  Social Connections: Not on file  Intimate Partner Violence: Not on file     BP 118/82   Pulse 85   Ht 5\' 9"  (1.753 m)   Wt 221 lb 12.8 oz (100.6 kg)   SpO2 97%   BMI 32.75 kg/m   Physical Exam:  Well appearing 60 yo man, NAD HEENT: Unremarkable Neck:  No JVD, no thyromegally Lymphatics:  No adenopathy Back:  No CVA tenderness Lungs:  Clear with no wheezes HEART:  Regular rate rhythm, no murmurs, no rubs, no clicks Abd:  soft, positive bowel sounds, no organomegally, no rebound, no guarding Ext:  2 plus pulses, no edema, no cyanosis, no clubbing Skin:  No rashes no nodules Neuro:  CN II through XII intact, motor grossly intact  EKG - nsr with pacing induced LBBB  DEVICE  Normal device function.  See PaceArt for details.   Assess/Plan:  CHB - he is asymptomatic s/p PPM lead revision. PPM - his medtronic DDD PM is working normally. He has 12 years of battery longevity. Thresholds are good. Obesity - he is encouraged to lose weight. We discussed his activity and I encouraged him to avoid using an ax or sledgehammer. 46 Meira Wahba,MD

## 2021-04-19 NOTE — Patient Instructions (Signed)
Medication Instructions:  Your physician recommends that you continue on your current medications as directed. Please refer to the Current Medication list given to you today.  Labwork: None ordered.  Testing/Procedures: None ordered.  Follow-Up: Your physician wants you to follow-up in: one year with Dr. Johney Frame or one of the following Advanced Practice Providers on your designated Care Team:   Francis Dowse, [place] Casimiro Needle "Mardelle Matte" Lanna Poche, [place]  Remote monitoring is used to monitor your Pacemaker from home. This monitoring reduces the number of office visits required to check your device to one time per year. It allows [place] to keep an eye on the functioning of your device to ensure it is working properly. You are scheduled for a device check from home on 07/17/2021. You may send your transmission at any time that day. If you have a wireless device, the transmission will be sent automatically. After your physician reviews your transmission, you will receive a postcard with your next transmission date.  Any Other Special Instructions Will Be Listed Below (If Applicable).  If you need a refill on your cardiac medications before your next appointment, please call your pharmacy.

## 2021-05-08 NOTE — Progress Notes (Signed)
Remote pacemaker transmission.   

## 2021-06-21 ENCOUNTER — Ambulatory Visit (INDEPENDENT_AMBULATORY_CARE_PROVIDER_SITE_OTHER): Payer: Self-pay | Admitting: Internal Medicine

## 2021-06-21 ENCOUNTER — Other Ambulatory Visit: Payer: Self-pay

## 2021-06-21 ENCOUNTER — Encounter: Payer: Self-pay | Admitting: Internal Medicine

## 2021-06-21 VITALS — BP 118/66 | HR 68 | Temp 98.4°F | Ht 68.5 in | Wt 220.0 lb

## 2021-06-21 DIAGNOSIS — E785 Hyperlipidemia, unspecified: Secondary | ICD-10-CM | POA: Diagnosis not present

## 2021-06-21 DIAGNOSIS — R739 Hyperglycemia, unspecified: Secondary | ICD-10-CM | POA: Insufficient documentation

## 2021-06-21 DIAGNOSIS — Z23 Encounter for immunization: Secondary | ICD-10-CM

## 2021-06-21 DIAGNOSIS — Z0001 Encounter for general adult medical examination with abnormal findings: Secondary | ICD-10-CM

## 2021-06-21 DIAGNOSIS — Z1159 Encounter for screening for other viral diseases: Secondary | ICD-10-CM | POA: Insufficient documentation

## 2021-06-21 LAB — LIPID PANEL
Cholesterol: 174 mg/dL (ref 0–200)
HDL: 38.4 mg/dL — ABNORMAL LOW (ref >39.00)
NonHDL: 135.75
Total CHOL/HDL Ratio: 5
Triglycerides: 257 mg/dL — ABNORMAL HIGH (ref 0.0–149.0)
VLDL: 51.4 mg/dL — ABNORMAL HIGH (ref 0.0–40.0)

## 2021-06-21 LAB — HEPATIC FUNCTION PANEL
ALT: 22 U/L (ref 0–53)
AST: 17 U/L (ref 0–37)
Albumin: 4.2 g/dL (ref 3.5–5.2)
Alkaline Phosphatase: 90 U/L (ref 39–117)
Bilirubin, Direct: 0.1 mg/dL (ref 0.0–0.3)
Total Bilirubin: 0.6 mg/dL (ref 0.2–1.2)
Total Protein: 6.5 g/dL (ref 6.0–8.3)

## 2021-06-21 LAB — URINALYSIS, ROUTINE W REFLEX MICROSCOPIC
Bilirubin Urine: NEGATIVE
Hgb urine dipstick: NEGATIVE
Ketones, ur: NEGATIVE
Leukocytes,Ua: NEGATIVE
Nitrite: NEGATIVE
RBC / HPF: NONE SEEN (ref 0–?)
Specific Gravity, Urine: 1.02 (ref 1.000–1.030)
Total Protein, Urine: NEGATIVE
Urine Glucose: NEGATIVE
Urobilinogen, UA: 0.2 (ref 0.0–1.0)
pH: 6 (ref 5.0–8.0)

## 2021-06-21 LAB — HEMOGLOBIN A1C: Hgb A1c MFr Bld: 6.1 % (ref 4.6–6.5)

## 2021-06-21 LAB — BASIC METABOLIC PANEL
BUN: 19 mg/dL (ref 6–23)
CO2: 24 mEq/L (ref 19–32)
Calcium: 9.7 mg/dL (ref 8.4–10.5)
Chloride: 106 mEq/L (ref 96–112)
Creatinine, Ser: 1.33 mg/dL (ref 0.40–1.50)
GFR: 58.43 mL/min — ABNORMAL LOW (ref >60.00)
Glucose, Bld: 97 mg/dL (ref 70–99)
Potassium: 4.5 mEq/L (ref 3.5–5.1)
Sodium: 141 mEq/L (ref 135–145)

## 2021-06-21 LAB — PSA: PSA: 1.23 ng/mL (ref 0.10–4.00)

## 2021-06-21 LAB — LDL CHOLESTEROL, DIRECT: Direct LDL: 118 mg/dL

## 2021-06-21 LAB — TSH: TSH: 2.77 u[IU]/mL (ref 0.35–5.50)

## 2021-06-21 NOTE — Patient Instructions (Signed)
Health Maintenance, Male Adopting a healthy lifestyle and getting preventive care are important in promoting health and wellness. Ask your health care provider about: The right schedule for you to have regular tests and exams. Things you can do on your own to prevent diseases and keep yourself healthy. What should I know about diet, weight, and exercise? Eat a healthy diet  Eat a diet that includes plenty of vegetables, fruits, low-fat dairy products, and lean protein. Do not eat a lot of foods that are high in solid fats, added sugars, or sodium.  Maintain a healthy weight Body mass index (BMI) is a measurement that can be used to identify possible weight problems. It estimates body fat based on height and weight. Your health care provider can help determine your BMI and help you achieve or maintain ahealthy weight. Get regular exercise Get regular exercise. This is one of the most important things you can do for your health. Most adults should: Exercise for at least 150 minutes each week. The exercise should increase your heart rate and make you sweat (moderate-intensity exercise). Do strengthening exercises at least twice a week. This is in addition to the moderate-intensity exercise. Spend less time sitting. Even light physical activity can be beneficial. Watch cholesterol and blood lipids Have your blood tested for lipids and cholesterol at 60 years of age, then havethis test every 5 years. You may need to have your cholesterol levels checked more often if: Your lipid or cholesterol levels are high. You are older than 60 years of age. You are at high risk for heart disease. What should I know about cancer screening? Many types of cancers can be detected early and may often be prevented. Depending on your health history and family history, you may need to have cancer screening at various ages. This may include screening for: Colorectal cancer. Prostate cancer. Skin cancer. Lung  cancer. What should I know about heart disease, diabetes, and high blood pressure? Blood pressure and heart disease High blood pressure causes heart disease and increases the risk of stroke. This is more likely to develop in people who have high blood pressure readings, are of African descent, or are overweight. Talk with your health care provider about your target blood pressure readings. Have your blood pressure checked: Every 3-5 years if you are 18-39 years of age. Every year if you are 40 years old or older. If you are between the ages of 65 and 75 and are a current or former smoker, ask your health care provider if you should have a one-time screening for abdominal aortic aneurysm (AAA). Diabetes Have regular diabetes screenings. This checks your fasting blood sugar level. Have the screening done: Once every three years after age 45 if you are at a normal weight and have a low risk for diabetes. More often and at a younger age if you are overweight or have a high risk for diabetes. What should I know about preventing infection? Hepatitis B If you have a higher risk for hepatitis B, you should be screened for this virus. Talk with your health care provider to find out if you are at risk forhepatitis B infection. Hepatitis C Blood testing is recommended for: Everyone born from 1945 through 1965. Anyone with known risk factors for hepatitis C. Sexually transmitted infections (STIs) You should be screened each year for STIs, including gonorrhea and chlamydia, if: You are sexually active and are younger than 60 years of age. You are older than 60 years of age   and your health care provider tells you that you are at risk for this type of infection. Your sexual activity has changed since you were last screened, and you are at increased risk for chlamydia or gonorrhea. Ask your health care provider if you are at risk. Ask your health care provider about whether you are at high risk for HIV.  Your health care provider may recommend a prescription medicine to help prevent HIV infection. If you choose to take medicine to prevent HIV, you should first get tested for HIV. You should then be tested every 3 months for as long as you are taking the medicine. Follow these instructions at home: Lifestyle Do not use any products that contain nicotine or tobacco, such as cigarettes, e-cigarettes, and chewing tobacco. If you need help quitting, ask your health care provider. Do not use street drugs. Do not share needles. Ask your health care provider for help if you need support or information about quitting drugs. Alcohol use Do not drink alcohol if your health care provider tells you not to drink. If you drink alcohol: Limit how much you have to 0-2 drinks a day. Be aware of how much alcohol is in your drink. In the [place] one drink equals one 12 oz bottle of beer (355 mL), one 5 oz glass of wine (148 mL), or one 1 oz glass of hard liquor (44 mL). General instructions Schedule regular health, dental, and eye exams. Stay current with your vaccines. Tell your health care provider if: You often feel depressed. You have ever been abused or do not feel safe at home. Summary Adopting a healthy lifestyle and getting preventive care are important in promoting health and wellness. Follow your health care provider's instructions about healthy diet, exercising, and getting tested or screened for diseases. Follow your health care provider's instructions on monitoring your cholesterol and blood pressure. This information is not intended to replace advice given to you by your health care provider. Make sure you discuss any questions you have with your healthcare provider. Document Revised: 10/08/2018 Document Reviewed: 10/08/2018 Elsevier Patient Education  2022 [employer]  

## 2021-06-21 NOTE — Progress Notes (Signed)
1   Subjective:  Patient ID: Brandon Ramirez, male    DOB: June 16, 1961  Age: 60 y.o. MRN: [id number]  CC: New Patient (Initial Visit) (Establish care)  This visit occurred during the SARS-CoV-2 public health emergency.  Safety protocols were in place, including screening questions prior to the visit, additional usage of staff PPE, and extensive cleaning of exam room while observing appropriate contact time as indicated for disinfecting solutions.    HPI Brandon Ramirez presents for a CPX and to establish.  He walks about an hour a day and does not experience chest pain, shortness of breath, palpitations, diaphoresis, edema, or fatigue.  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   He has a past surgical history that includes PACEMAKER IMPLANT (N/A, 08/15/2018); LEFT HEART CATH AND CORONARY ANGIOGRAPHY (N/A, 08/15/2018); and Pacemaker lead removal (Left, 01/13/2021).   His family history includes Heart Problems in his father.He reports that he has never smoked. He has never used smokeless tobacco. He reports current alcohol use of about 3.0 standard drinks per week. He reports that he does not use drugs.  Outpatient Medications Prior to Visit  Medication Sig Dispense Refill   acetaminophen (TYLENOL) 500 MG tablet Take 500 mg by mouth every 6 (six) hours as needed for moderate pain or headache.     Ascorbic Acid (VITAMIN C PO) Take 1 tablet by mouth daily as needed (immune support).     calcium  carbonate (TUMS - DOSED IN MG ELEMENTAL CALCIUM) 500 MG chewable tablet Chew 1,000-1,500 mg by mouth daily as needed for indigestion or heartburn.     cetirizine (ZYRTEC) 10 MG tablet Take 10 mg by mouth daily as needed for allergies.     Cholecalciferol (VITAMIN D) 50 MCG (2000 UT) tablet Take 4,000 Units by mouth daily.     Melatonin 5 MG CAPS Take 5-10 mg by mouth at bedtime as needed (sleep).     Multiple Vitamins-Minerals (ZINC PO) Take 1 tablet by mouth daily.     multivitamin (ONE-A-DAY MEN'S) TABS tablet Take 1 tablet by mouth daily.     sildenafil (VIAGRA) 25 MG tablet Take 25 mg by mouth daily as needed for erectile dysfunction.     diphenhydrAMINE (BENADRYL) 25 MG tablet Take 25 mg by mouth every 8 (eight) hours as needed for allergies or sleep.     Heat Wraps (THERMACARE BACK/HIP) MISC Place 1 patch onto the skin daily as needed (pain).     ibuprofen (ADVIL,MOTRIN) 200 MG tablet Take 400 mg by mouth every 6 (six) hours as needed for mild pain.     EPINEPHrine 0.3 mg/0.3 mL IJ SOAJ injection Inject 0.3 mg into the muscle as needed for anaphylaxis. (Patient not taking: Reported on 06/21/2021)     No facility-administered medications prior to visit.    ROS Review of Systems  Constitutional:  Negative for appetite change, diaphoresis, fatigue and unexpected weight change.  HENT: Negative.    Eyes: Negative.   Respiratory:  Negative for cough, chest tightness, shortness of breath and wheezing.   Cardiovascular:  Negative for chest pain, palpitations and leg swelling.  Gastrointestinal:  Negative for abdominal pain, constipation,  diarrhea, nausea and vomiting.  Endocrine: Negative.   Genitourinary: Negative.  Negative for difficulty urinating, dysuria, scrotal swelling and testicular pain.  Musculoskeletal: Negative.  Negative for arthralgias and myalgias.  Skin: Negative.  Negative for color change and pallor.  Neurological:  Negative for dizziness, weakness and light-headedness.   Hematological:  Negative for adenopathy. Does not bruise/bleed easily.  Psychiatric/Behavioral: Negative.     Objective:  BP 118/66 (BP Location: Right Arm, Patient Position: Sitting, Cuff Size: Large)   Pulse 68   Temp 98.4 F (36.9 C) (Oral)   Ht 5' 8.5" (1.74 m)   Wt 220 lb (99.8 kg)   SpO2 95%   BMI 32.96 kg/m   Physical Exam Vitals reviewed.  HENT:     Nose: Nose normal.     Mouth/Throat:     Mouth: Mucous membranes are moist.  Eyes:     General: No scleral icterus.    Conjunctiva/sclera: Conjunctivae normal.  Cardiovascular:     Rate and Rhythm: Normal rate and regular rhythm.     Heart sounds: No murmur heard. Pulmonary:     Effort: Pulmonary effort is normal.     Breath sounds: No stridor. No wheezing, rhonchi or rales.  Abdominal:     General: Abdomen is flat.     Palpations: There is no mass.     Tenderness: There is no abdominal tenderness. There is no guarding.     Hernia: No hernia is present. There is no hernia in the left inguinal area or right inguinal area.  Genitourinary:    Pubic Area: No rash.      Penis: Normal.      Testes: Normal.     Epididymis:     Right: Normal.     Left: Normal.     Prostate: Normal. Not enlarged, not tender and no nodules present.     Rectum: Normal. Guaiac result negative. No mass, tenderness, anal fissure, external hemorrhoid or internal hemorrhoid. Normal anal tone.  Musculoskeletal:        General: Normal range of motion.     Cervical back: Neck supple.     Right lower leg: No edema.     Left lower leg: No edema.  Lymphadenopathy:     Cervical: No cervical adenopathy.     Lower Body: No right inguinal adenopathy. No left inguinal adenopathy.  Skin:    General: Skin is warm and dry.  Neurological:     General: No focal deficit present.     Mental Status: He is alert.  Psychiatric:        Behavior: Behavior normal.    Lab Results  Component Value Date   WBC 7.2 01/12/2021   HGB 14.4 01/12/2021   HCT 42.4  01/12/2021   PLT 223 01/12/2021   GLUCOSE 97 06/21/2021   CHOL 174 06/21/2021   TRIG 257.0 (H) 06/21/2021   HDL 38.40 (L) 06/21/2021   LDLDIRECT 118.0 06/21/2021   LDLCALC 93 08/15/2018   ALT 22 06/21/2021   AST 17 06/21/2021   NA 141 06/21/2021   K 4.5 06/21/2021   CL 106 06/21/2021   CREATININE 1.33 06/21/2021   BUN 19 06/21/2021   CO2 24 06/21/2021   TSH 2.77 06/21/2021   PSA 1.23 06/21/2021   HGBA1C 6.1 06/21/2021      Assessment & Plan:   Brandon Ramirez was seen today for new patient (initial visit).  Diagnoses and all orders for this visit:  Hyperlipidemia with target LDL less than 100- He has  a low ASCVD risk score.  Statin therapy is not indicated. -     Lipid panel; Future -     TSH; Future -     Hepatic function panel; Future -     Hepatic function panel -     TSH -     Lipid panel  Hyperglycemia- He is prediabetic.  I encouraged him to improve his lifestyle modifications. -     Basic metabolic panel; Future -     Hemoglobin A1c; Future -     Urinalysis, Routine w reflex microscopic; Future -     Urinalysis, Routine w reflex microscopic -     Hemoglobin A1c -     Basic metabolic panel  Need for hepatitis C screening test -     Hepatitis C antibody; Future -     Hepatitis C antibody  Encounter for general adult medical examination with abnormal findings- Exam completed, labs reviewed, vaccines reviewed and updated, cancer screenings are up-to-date, patient education was given. -     PSA; Future -     Hepatitis C antibody; Future -     Hepatitis C antibody -     PSA  Need for vaccination -     Tdap vaccine greater than or equal to 7yo IM  Other orders -     LDL cholesterol, direct  I have discontinued Brandon Ramirez's ibuprofen, diphenhydrAMINE, and ThermaCare Back/Hip. I am also having him maintain his cetirizine, multivitamin, Vitamin D, Multiple Vitamins-Minerals (ZINC PO), Ascorbic Acid (VITAMIN C PO), acetaminophen, Melatonin, calcium carbonate,  sildenafil, and EPINEPHrine.  No orders of the defined types were placed in this encounter.    Follow-up: Return in about 6 months (around 12/22/2021).  Sanda Linger, MD

## 2021-06-22 ENCOUNTER — Other Ambulatory Visit: Payer: Self-pay

## 2021-06-22 LAB — HEPATITIS C ANTIBODY
Hepatitis C Ab: NONREACTIVE
SIGNAL TO CUT-OFF: 0.01 (ref <1.00)

## 2021-06-23 ENCOUNTER — Encounter: Payer: Self-pay | Admitting: Internal Medicine

## 2021-07-17 ENCOUNTER — Ambulatory Visit (INDEPENDENT_AMBULATORY_CARE_PROVIDER_SITE_OTHER): Payer: PRIVATE HEALTH INSURANCE

## 2021-07-17 DIAGNOSIS — I442 Atrioventricular block, complete: Secondary | ICD-10-CM | POA: Diagnosis not present

## 2021-07-17 DIAGNOSIS — Z950 Presence of cardiac pacemaker: Secondary | ICD-10-CM

## 2021-07-18 LAB — CUP PACEART REMOTE DEVICE CHECK
Battery Remaining Longevity: 149 mo
Battery Voltage: 3.15 V
Brady Statistic AP VP Percent: 2.72 %
Brady Statistic AP VS Percent: 0 %
Brady Statistic AS VP Percent: 97.26 %
Brady Statistic AS VS Percent: 0.02 %
Brady Statistic RA Percent Paced: 2.71 %
Brady Statistic RV Percent Paced: 99.98 %
Date Time Interrogation Session: 20220919034812
Implantable Lead Implant Date: 20191018
Implantable Lead Implant Date: 20220318
Implantable Lead Location: 753859
Implantable Lead Location: 753860
Implantable Lead Model: 5076
Implantable Lead Model: 5076
Implantable Pulse Generator Implant Date: 20220318
Lead Channel Impedance Value: 323 Ohm
Lead Channel Impedance Value: 380 Ohm
Lead Channel Impedance Value: 532 Ohm
Lead Channel Impedance Value: 551 Ohm
Lead Channel Pacing Threshold Amplitude: 0.875 V
Lead Channel Pacing Threshold Amplitude: 0.875 V
Lead Channel Pacing Threshold Pulse Width: 0.4 ms
Lead Channel Pacing Threshold Pulse Width: 0.4 ms
Lead Channel Sensing Intrinsic Amplitude: 2.875 mV
Lead Channel Sensing Intrinsic Amplitude: 2.875 mV
Lead Channel Sensing Intrinsic Amplitude: 9.875 mV
Lead Channel Sensing Intrinsic Amplitude: 9.875 mV
Lead Channel Setting Pacing Amplitude: 1.75 V
Lead Channel Setting Pacing Amplitude: 2 V
Lead Channel Setting Pacing Pulse Width: 0.4 ms
Lead Channel Setting Sensing Sensitivity: 0.9 mV

## 2021-07-24 NOTE — Progress Notes (Signed)
Remote pacemaker transmission.   

## 2021-08-03 ENCOUNTER — Other Ambulatory Visit: Payer: Self-pay | Admitting: Internal Medicine

## 2021-08-03 ENCOUNTER — Encounter: Payer: Self-pay | Admitting: Internal Medicine

## 2021-08-03 DIAGNOSIS — Z91013 Allergy to seafood: Secondary | ICD-10-CM

## 2021-08-03 MED ORDER — EPINEPHRINE 0.3 MG/0.3ML IJ SOAJ: 0.3000 mg | INTRAMUSCULAR | 3 refills | Status: DC | PRN

## 2021-10-16 ENCOUNTER — Ambulatory Visit (INDEPENDENT_AMBULATORY_CARE_PROVIDER_SITE_OTHER): Payer: 59

## 2021-10-16 DIAGNOSIS — I442 Atrioventricular block, complete: Secondary | ICD-10-CM

## 2021-10-16 LAB — CUP PACEART REMOTE DEVICE CHECK
Battery Remaining Longevity: 146 mo
Battery Voltage: 3.1 V
Brady Statistic AP VP Percent: 1.05 %
Brady Statistic AP VS Percent: 0 %
Brady Statistic AS VP Percent: 98.92 %
Brady Statistic AS VS Percent: 0.02 %
Brady Statistic RA Percent Paced: 1.05 %
Brady Statistic RV Percent Paced: 99.98 %
Date Time Interrogation Session: 20221218200913
Implantable Lead Implant Date: 20191018
Implantable Lead Implant Date: 20220318
Implantable Lead Location: 753859
Implantable Lead Location: 753860
Implantable Lead Model: 5076
Implantable Lead Model: 5076
Implantable Pulse Generator Implant Date: 20220318
Lead Channel Impedance Value: 361 Ohm
Lead Channel Impedance Value: 399 Ohm
Lead Channel Impedance Value: 551 Ohm
Lead Channel Impedance Value: 589 Ohm
Lead Channel Pacing Threshold Amplitude: 0.75 V
Lead Channel Pacing Threshold Amplitude: 0.875 V
Lead Channel Pacing Threshold Pulse Width: 0.4 ms
Lead Channel Pacing Threshold Pulse Width: 0.4 ms
Lead Channel Sensing Intrinsic Amplitude: 2.375 mV
Lead Channel Sensing Intrinsic Amplitude: 2.375 mV
Lead Channel Sensing Intrinsic Amplitude: 9.875 mV
Lead Channel Sensing Intrinsic Amplitude: 9.875 mV
Lead Channel Setting Pacing Amplitude: 2 V
Lead Channel Setting Pacing Amplitude: 2 V
Lead Channel Setting Pacing Pulse Width: 0.4 ms
Lead Channel Setting Sensing Sensitivity: 0.9 mV

## 2021-10-25 NOTE — Progress Notes (Signed)
Remote pacemaker transmission.   

## 2022-01-15 ENCOUNTER — Ambulatory Visit (INDEPENDENT_AMBULATORY_CARE_PROVIDER_SITE_OTHER): Payer: 59

## 2022-01-15 DIAGNOSIS — I442 Atrioventricular block, complete: Secondary | ICD-10-CM

## 2022-01-15 LAB — CUP PACEART REMOTE DEVICE CHECK
Battery Remaining Longevity: 141 mo
Battery Voltage: 3.06 V
Brady Statistic AP VP Percent: 0.42 %
Brady Statistic AP VS Percent: 0 %
Brady Statistic AS VP Percent: 99.55 %
Brady Statistic AS VS Percent: 0.03 %
Brady Statistic RA Percent Paced: 0.42 %
Brady Statistic RV Percent Paced: 99.97 %
Date Time Interrogation Session: 20230319231402
Implantable Lead Implant Date: 20191018
Implantable Lead Implant Date: 20220318
Implantable Lead Location: 753859
Implantable Lead Location: 753860
Implantable Lead Model: 5076
Implantable Lead Model: 5076
Implantable Pulse Generator Implant Date: 20220318
Lead Channel Impedance Value: 323 Ohm
Lead Channel Impedance Value: 361 Ohm
Lead Channel Impedance Value: 456 Ohm
Lead Channel Impedance Value: 494 Ohm
Lead Channel Pacing Threshold Amplitude: 0.625 V
Lead Channel Pacing Threshold Amplitude: 0.875 V
Lead Channel Pacing Threshold Pulse Width: 0.4 ms
Lead Channel Pacing Threshold Pulse Width: 0.4 ms
Lead Channel Sensing Intrinsic Amplitude: 2.625 mV
Lead Channel Sensing Intrinsic Amplitude: 2.625 mV
Lead Channel Sensing Intrinsic Amplitude: 9.875 mV
Lead Channel Sensing Intrinsic Amplitude: 9.875 mV
Lead Channel Setting Pacing Amplitude: 1.75 V
Lead Channel Setting Pacing Amplitude: 2 V
Lead Channel Setting Pacing Pulse Width: 0.4 ms
Lead Channel Setting Sensing Sensitivity: 0.9 mV

## 2022-01-24 NOTE — Progress Notes (Signed)
Remote pacemaker transmission.   

## 2022-04-16 ENCOUNTER — Ambulatory Visit (INDEPENDENT_AMBULATORY_CARE_PROVIDER_SITE_OTHER): Payer: PRIVATE HEALTH INSURANCE

## 2022-04-16 DIAGNOSIS — I442 Atrioventricular block, complete: Secondary | ICD-10-CM | POA: Diagnosis not present

## 2022-04-18 LAB — CUP PACEART REMOTE DEVICE CHECK
Battery Remaining Longevity: 138 mo
Battery Voltage: 3.04 V
Brady Statistic AP VP Percent: 3.61 %
Brady Statistic AP VS Percent: 0 %
Brady Statistic AS VP Percent: 96.36 %
Brady Statistic AS VS Percent: 0.03 %
Brady Statistic RA Percent Paced: 3.6 %
Brady Statistic RV Percent Paced: 99.97 %
Date Time Interrogation Session: 20230618223514
Implantable Lead Implant Date: 20191018
Implantable Lead Implant Date: 20220318
Implantable Lead Location: 753859
Implantable Lead Location: 753860
Implantable Lead Model: 5076
Implantable Lead Model: 5076
Implantable Pulse Generator Implant Date: 20220318
Lead Channel Impedance Value: 342 Ohm
Lead Channel Impedance Value: 342 Ohm
Lead Channel Impedance Value: 494 Ohm
Lead Channel Impedance Value: 494 Ohm
Lead Channel Pacing Threshold Amplitude: 0.75 V
Lead Channel Pacing Threshold Amplitude: 0.75 V
Lead Channel Pacing Threshold Pulse Width: 0.4 ms
Lead Channel Pacing Threshold Pulse Width: 0.4 ms
Lead Channel Sensing Intrinsic Amplitude: 3.5 mV
Lead Channel Sensing Intrinsic Amplitude: 3.5 mV
Lead Channel Sensing Intrinsic Amplitude: 9.875 mV
Lead Channel Sensing Intrinsic Amplitude: 9.875 mV
Lead Channel Setting Pacing Amplitude: 1.5 V
Lead Channel Setting Pacing Amplitude: 2 V
Lead Channel Setting Pacing Pulse Width: 0.4 ms
Lead Channel Setting Sensing Sensitivity: 0.9 mV

## 2022-05-08 NOTE — Progress Notes (Signed)
Remote pacemaker transmission.   

## 2022-07-09 ENCOUNTER — Encounter: Payer: Self-pay | Admitting: Internal Medicine

## 2022-07-16 ENCOUNTER — Ambulatory Visit (INDEPENDENT_AMBULATORY_CARE_PROVIDER_SITE_OTHER): Payer: PRIVATE HEALTH INSURANCE

## 2022-07-16 DIAGNOSIS — I442 Atrioventricular block, complete: Secondary | ICD-10-CM

## 2022-07-17 LAB — CUP PACEART REMOTE DEVICE CHECK
Battery Remaining Longevity: 134 mo
Battery Voltage: 3.03 V
Brady Statistic AP VP Percent: 4.91 %
Brady Statistic AP VS Percent: 0 %
Brady Statistic AS VP Percent: 95.06 %
Brady Statistic AS VS Percent: 0.03 %
Brady Statistic RA Percent Paced: 4.91 %
Brady Statistic RV Percent Paced: 99.97 %
Date Time Interrogation Session: 20230917233313
Implantable Lead Implant Date: 20191018
Implantable Lead Implant Date: 20220318
Implantable Lead Location: 753859
Implantable Lead Location: 753860
Implantable Lead Model: 5076
Implantable Lead Model: 5076
Implantable Pulse Generator Implant Date: 20220318
Lead Channel Impedance Value: 323 Ohm
Lead Channel Impedance Value: 342 Ohm
Lead Channel Impedance Value: 475 Ohm
Lead Channel Impedance Value: 494 Ohm
Lead Channel Pacing Threshold Amplitude: 0.625 V
Lead Channel Pacing Threshold Amplitude: 0.75 V
Lead Channel Pacing Threshold Pulse Width: 0.4 ms
Lead Channel Pacing Threshold Pulse Width: 0.4 ms
Lead Channel Sensing Intrinsic Amplitude: 3.125 mV
Lead Channel Sensing Intrinsic Amplitude: 3.125 mV
Lead Channel Sensing Intrinsic Amplitude: 9.875 mV
Lead Channel Sensing Intrinsic Amplitude: 9.875 mV
Lead Channel Setting Pacing Amplitude: 1.5 V
Lead Channel Setting Pacing Amplitude: 2 V
Lead Channel Setting Pacing Pulse Width: 0.4 ms
Lead Channel Setting Sensing Sensitivity: 0.9 mV

## 2022-07-30 NOTE — Progress Notes (Signed)
Remote pacemaker transmission.   

## 2022-08-06 ENCOUNTER — Ambulatory Visit (INDEPENDENT_AMBULATORY_CARE_PROVIDER_SITE_OTHER): Payer: 59 | Admitting: Internal Medicine

## 2022-08-06 ENCOUNTER — Encounter: Payer: Self-pay | Admitting: Internal Medicine

## 2022-08-06 VITALS — BP 120/84 | HR 85 | Temp 98.5°F | Ht 68.5 in | Wt 225.0 lb

## 2022-08-06 DIAGNOSIS — Z0000 Encounter for general adult medical examination without abnormal findings: Secondary | ICD-10-CM | POA: Diagnosis not present

## 2022-08-06 DIAGNOSIS — R0609 Other forms of dyspnea: Secondary | ICD-10-CM | POA: Insufficient documentation

## 2022-08-06 DIAGNOSIS — Z23 Encounter for immunization: Secondary | ICD-10-CM | POA: Diagnosis not present

## 2022-08-06 DIAGNOSIS — Z0001 Encounter for general adult medical examination with abnormal findings: Secondary | ICD-10-CM

## 2022-08-06 DIAGNOSIS — E669 Obesity, unspecified: Secondary | ICD-10-CM

## 2022-08-06 DIAGNOSIS — R9431 Abnormal electrocardiogram [ECG] [EKG]: Secondary | ICD-10-CM

## 2022-08-06 DIAGNOSIS — E785 Hyperlipidemia, unspecified: Secondary | ICD-10-CM

## 2022-08-06 DIAGNOSIS — R739 Hyperglycemia, unspecified: Secondary | ICD-10-CM | POA: Diagnosis not present

## 2022-08-06 LAB — CBC WITH DIFFERENTIAL/PLATELET
Basophils Absolute: 0 10*3/uL (ref 0.0–0.1)
Basophils Relative: 0.6 % (ref 0.0–3.0)
Eosinophils Absolute: 0.1 10*3/uL (ref 0.0–0.7)
Eosinophils Relative: 1.3 % (ref 0.0–5.0)
HCT: 43.6 % (ref 39.0–52.0)
Hemoglobin: 15.2 g/dL (ref 13.0–17.0)
Lymphocytes Relative: 19.6 % (ref 12.0–46.0)
Lymphs Abs: 1.5 10*3/uL (ref 0.7–4.0)
MCHC: 34.9 g/dL (ref 30.0–36.0)
MCV: 85.5 fl (ref 78.0–100.0)
Monocytes Absolute: 0.6 10*3/uL (ref 0.1–1.0)
Monocytes Relative: 7.5 % (ref 3.0–12.0)
Neutro Abs: 5.5 10*3/uL (ref 1.4–7.7)
Neutrophils Relative %: 71 % (ref 43.0–77.0)
Platelets: 202 10*3/uL (ref 150.0–400.0)
RBC: 5.1 Mil/uL (ref 4.22–5.81)
RDW: 13.1 % (ref 11.5–15.5)
WBC: 7.7 10*3/uL (ref 4.0–10.5)

## 2022-08-06 LAB — LIPID PANEL
Cholesterol: 206 mg/dL — ABNORMAL HIGH (ref 0–200)
HDL: 37.8 mg/dL — ABNORMAL LOW (ref >39.00)
NonHDL: 168.46
Total CHOL/HDL Ratio: 5
Triglycerides: 234 mg/dL — ABNORMAL HIGH (ref 0.0–149.0)
VLDL: 46.8 mg/dL — ABNORMAL HIGH (ref 0.0–40.0)

## 2022-08-06 LAB — BASIC METABOLIC PANEL
BUN: 22 mg/dL (ref 6–23)
CO2: 23 mEq/L (ref 19–32)
Calcium: 9.7 mg/dL (ref 8.4–10.5)
Chloride: 104 mEq/L (ref 96–112)
Creatinine, Ser: 1.41 mg/dL (ref 0.40–1.50)
GFR: 54.05 mL/min — ABNORMAL LOW (ref >60.00)
Glucose, Bld: 111 mg/dL — ABNORMAL HIGH (ref 70–99)
Potassium: 4.2 mEq/L (ref 3.5–5.1)
Sodium: 138 mEq/L (ref 135–145)

## 2022-08-06 LAB — HEPATIC FUNCTION PANEL
ALT: 27 U/L (ref 0–53)
AST: 20 U/L (ref 0–37)
Albumin: 4.4 g/dL (ref 3.5–5.2)
Alkaline Phosphatase: 76 U/L (ref 39–117)
Bilirubin, Direct: 0.1 mg/dL (ref 0.0–0.3)
Total Bilirubin: 0.6 mg/dL (ref 0.2–1.2)
Total Protein: 7.3 g/dL (ref 6.0–8.3)

## 2022-08-06 LAB — LDL CHOLESTEROL, DIRECT: Direct LDL: 141 mg/dL

## 2022-08-06 LAB — TSH: TSH: 3.02 u[IU]/mL (ref 0.35–5.50)

## 2022-08-06 LAB — HEMOGLOBIN A1C: Hgb A1c MFr Bld: 6.2 % (ref 4.6–6.5)

## 2022-08-06 LAB — BRAIN NATRIURETIC PEPTIDE: Pro B Natriuretic peptide (BNP): 23 pg/mL (ref 0.0–100.0)

## 2022-08-06 LAB — TROPONIN I (HIGH SENSITIVITY): High Sens Troponin I: 8 ng/L (ref 2–17)

## 2022-08-06 NOTE — Patient Instructions (Signed)
Health Maintenance, Male Adopting a healthy lifestyle and getting preventive care are important in promoting health and wellness. Ask your health care provider about: The right schedule for you to have regular tests and exams. Things you can do on your own to prevent diseases and keep yourself healthy. What should I know about diet, weight, and exercise? Eat a healthy diet  Eat a diet that includes plenty of vegetables, fruits, low-fat dairy products, and lean protein. Do not eat a lot of foods that are high in solid fats, added sugars, or sodium. Maintain a healthy weight Body mass index (BMI) is a measurement that can be used to identify possible weight problems. It estimates body fat based on height and weight. Your health care provider can help determine your BMI and help you achieve or maintain a healthy weight. Get regular exercise Get regular exercise. This is one of the most important things you can do for your health. Most adults should: Exercise for at least 150 minutes each week. The exercise should increase your heart rate and make you sweat (moderate-intensity exercise). Do strengthening exercises at least twice a week. This is in addition to the moderate-intensity exercise. Spend less time sitting. Even light physical activity can be beneficial. Watch cholesterol and blood lipids Have your blood tested for lipids and cholesterol at 61 years of age, then have this test every 5 years. You may need to have your cholesterol levels checked more often if: Your lipid or cholesterol levels are high. You are older than 61 years of age. You are at high risk for heart disease. What should I know about cancer screening? Many types of cancers can be detected early and may often be prevented. Depending on your health history and family history, you may need to have cancer screening at various ages. This may include screening for: Colorectal cancer. Prostate cancer. Skin cancer. Lung  cancer. What should I know about heart disease, diabetes, and high blood pressure? Blood pressure and heart disease High blood pressure causes heart disease and increases the risk of stroke. This is more likely to develop in people who have high blood pressure readings or are overweight. Talk with your health care provider about your target blood pressure readings. Have your blood pressure checked: Every 3-5 years if you are 18-39 years of age. Every year if you are 40 years old or older. If you are between the ages of 65 and 75 and are a current or former smoker, ask your health care provider if you should have a one-time screening for abdominal aortic aneurysm (AAA). Diabetes Have regular diabetes screenings. This checks your fasting blood sugar level. Have the screening done: Once every three years after age 45 if you are at a normal weight and have a low risk for diabetes. More often and at a younger age if you are overweight or have a high risk for diabetes. What should I know about preventing infection? Hepatitis B If you have a higher risk for hepatitis B, you should be screened for this virus. Talk with your health care provider to find out if you are at risk for hepatitis B infection. Hepatitis C Blood testing is recommended for: Everyone born from 1945 through 1965. Anyone with known risk factors for hepatitis C. Sexually transmitted infections (STIs) You should be screened each year for STIs, including gonorrhea and chlamydia, if: You are sexually active and are younger than 61 years of age. You are older than 61 years of age and your   health care provider tells you that you are at risk for this type of infection. Your sexual activity has changed since you were last screened, and you are at increased risk for chlamydia or gonorrhea. Ask your health care provider if you are at risk. Ask your health care provider about whether you are at high risk for HIV. Your health care provider  may recommend a prescription medicine to help prevent HIV infection. If you choose to take medicine to prevent HIV, you should first get tested for HIV. You should then be tested every 3 months for as long as you are taking the medicine. Follow these instructions at home: Alcohol use Do not drink alcohol if your health care provider tells you not to drink. If you drink alcohol: Limit how much you have to 0-2 drinks a day. Know how much alcohol is in your drink. In the [place] one drink equals one 12 oz bottle of beer (355 mL), one 5 oz glass of wine (148 mL), or one 1 oz glass of hard liquor (44 mL). Lifestyle Do not use any products that contain nicotine or tobacco. These products include cigarettes, chewing tobacco, and vaping devices, such as e-cigarettes. If you need help quitting, ask your health care provider. Do not use street drugs. Do not share needles. Ask your health care provider for help if you need support or information about quitting drugs. General instructions Schedule regular health, dental, and eye exams. Stay current with your vaccines. Tell your health care provider if: You often feel depressed. You have ever been abused or do not feel safe at home. Summary Adopting a healthy lifestyle and getting preventive care are important in promoting health and wellness. Follow your health care provider's instructions about healthy diet, exercising, and getting tested or screened for diseases. Follow your health care provider's instructions on monitoring your cholesterol and blood pressure. This information is not intended to replace advice given to you by your health care provider. Make sure you discuss any questions you have with your health care provider. Document Revised: 03/06/2021 Document Reviewed: 03/06/2021 Elsevier Patient Education  2023 [employer] Inc[employer]  

## 2022-08-06 NOTE — Progress Notes (Signed)
Subjective:  Patient ID: Brandon Ramirez, male    DOB: [birth year]  Age: 61 y.o. MRN: [id number]  CC: Annual Exam and Hyperlipidemia   HPI Brandon Ramirez presents for a CPX and f/up -  He complains of DOE but denies CP, diaphoresis, edema, or palpitations.  Outpatient Medications Prior to Visit  Medication Sig Dispense Refill   acetaminophen (TYLENOL) 500 MG tablet Take 500 mg by mouth every 6 (six) hours as needed for moderate pain or headache.     Ascorbic Acid (VITAMIN C PO) Take 1 tablet by mouth daily as needed (immune support).     calcium carbonate (TUMS - DOSED IN MG ELEMENTAL CALCIUM) 500 MG chewable tablet Chew 1,000-1,500 mg by mouth daily as needed for indigestion or heartburn.     cetirizine (ZYRTEC) 10 MG tablet Take 10 mg by mouth daily as needed for allergies.     Cholecalciferol (VITAMIN D) 50 MCG (2000 UT) tablet Take 4,000 Units by mouth daily.     EPINEPHrine 0.3 mg/0.3 mL IJ SOAJ injection Inject 0.3 mg into the muscle as needed for anaphylaxis. 2 each 3   Melatonin 5 MG CAPS Take 5-10 mg by mouth at bedtime as needed (sleep).     Multiple Vitamins-Minerals (ZINC PO) Take 1 tablet by mouth daily.     multivitamin (ONE-A-DAY MEN'S) TABS tablet Take 1 tablet by mouth daily.     sildenafil (VIAGRA) 25 MG tablet Take 25 mg by mouth daily as needed for erectile dysfunction.     No facility-administered medications prior to visit.    ROS Review of Systems  Constitutional: Negative.  Negative for chills, diaphoresis, fatigue and fever.  HENT: Negative.    Eyes: Negative.   Respiratory:  Positive for shortness of breath. Negative for choking, chest tightness and wheezing.   Cardiovascular:  Negative for chest pain, palpitations and leg swelling.  Gastrointestinal:  Negative for abdominal pain, constipation, diarrhea, nausea and vomiting.  Endocrine: Negative.   Genitourinary: Negative.  Negative for difficulty urinating.  Musculoskeletal: Negative.   Skin: Negative.    Neurological:  Negative for dizziness, weakness and headaches.  Hematological:  Negative for adenopathy. Does not bruise/bleed easily.  Psychiatric/Behavioral: Negative.      Objective:  BP 120/84   Pulse 85   Temp 98.5 F (36.9 C) (Oral)   Ht 5' 8.5" (1.74 m)   Wt 225 lb (102.1 kg)   SpO2 95%   BMI 33.71 kg/m   BP Readings from Last 3 Encounters:  08/06/22 120/84  06/21/21 118/66  04/19/21 118/82    Wt Readings from Last 3 Encounters:  08/06/22 225 lb (102.1 kg)  06/21/21 220 lb (99.8 kg)  04/19/21 221 lb 12.8 oz (100.6 kg)    Physical Exam Vitals reviewed.  HENT:     Nose: Nose normal.     Mouth/Throat:     Mouth: Mucous membranes are moist.  Eyes:     General: No scleral icterus.    Conjunctiva/sclera: Conjunctivae normal.  Cardiovascular:     Rate and Rhythm: Normal rate and regular rhythm.     Heart sounds: Normal heart sounds, S1 normal and S2 normal.     No gallop.     Comments: EKG-  NSR, 81 bpm LAD LBBB unchanged Pulmonary:     Effort: Pulmonary effort is normal.     Breath sounds: No wheezing, rhonchi or rales.  Abdominal:     General: Abdomen is flat.     Palpations: There is no mass.  Tenderness: There is no abdominal tenderness. There is no guarding or rebound.     Hernia: No hernia is present. There is no hernia in the left inguinal area or right inguinal area.  Genitourinary:    Pubic Area: No rash.      Penis: Normal and circumcised. No swelling.      Testes: Normal.     Epididymis:     Right: Normal.     Left: Normal.     Prostate: Normal. Not enlarged, not tender and no nodules present.     Rectum: Normal. Guaiac result negative. No mass, tenderness, anal fissure, external hemorrhoid or internal hemorrhoid. Normal anal tone.  Musculoskeletal:     Cervical back: Neck supple.     Right lower leg: No edema.     Left lower leg: No edema.  Lymphadenopathy:     Cervical: No cervical adenopathy.     Lower Body: No right inguinal  adenopathy. No left inguinal adenopathy.  Skin:    General: Skin is warm and dry.  Neurological:     General: No focal deficit present.     Mental Status: He is alert.  Psychiatric:        Mood and Affect: Mood normal.        Behavior: Behavior normal.     Lab Results  Component Value Date   WBC 7.7 08/06/2022   HGB 15.2 08/06/2022   HCT 43.6 08/06/2022   PLT 202.0 08/06/2022   GLUCOSE 111 (H) 08/06/2022   CHOL 206 (H) 08/06/2022   TRIG 234.0 (H) 08/06/2022   HDL 37.80 (L) 08/06/2022   LDLDIRECT 141.0 08/06/2022   LDLCALC 93 08/15/2018   ALT 27 08/06/2022   AST 20 08/06/2022   NA 138 08/06/2022   K 4.2 08/06/2022   CL 104 08/06/2022   CREATININE 1.41 08/06/2022   BUN 22 08/06/2022   CO2 23 08/06/2022   TSH 3.02 08/06/2022   PSA 1.23 06/21/2021   HGBA1C 6.2 08/06/2022    DG Chest 2 View  Result Date: 01/14/2021 CLINICAL DATA:  Pacemaker lead failure EXAM: CHEST - 2 VIEW COMPARISON:  08/16/2018 FINDINGS: Pacer noted with proximal distal leads projecting over the right atrium and ventricle, respectively. No obvious lead discontinuity. No pneumothorax. The lungs appear clear. Heart size within normal limits. The patient was unable to lift her left arm for the lateral projection, obscuring portions of the chest on the lateral projection. Hand and finger bones project over the left lung base on the frontal projection. IMPRESSION: 1. Pacer leads project over the right atrium and ventricle. No pneumothorax or complicating feature observed. Electronically Signed   By: Gaylyn Rong M.D.   On: 01/14/2021 10:29    Assessment & Plan:   Brandon Ramirez was seen today for annual exam and hyperlipidemia.  Diagnoses and all orders for this visit:  DOE (dyspnea on exertion)- His labs and EKG are reassuring.  Will assess his risk with a coronary calcium score. -     CBC with Differential/Platelet; Future -     Troponin I (High Sensitivity); Future -     Brain natriuretic peptide; Future -      EKG 12-Lead -     Cancel: MYOCARDIAL PERFUSION IMAGING; Future -     Cancel: Cardiac Stress Test: Informed Consent Details: Physician/Practitioner Attestation; Transcribe to consent form and obtain patient signature; Future -     Brain natriuretic peptide -     Troponin I (High Sensitivity) -  CBC with Differential/Platelet -     Cancel: CT CARDIAC SCORING (SELF PAY ONLY); Future -     CT CARDIAC SCORING ([facility] LOCATIONS ONLY); Future  Hyperlipidemia with target LDL less than 100- Statin is not indicated. -     Lipid panel; Future -     TSH; Future -     Hepatic function panel; Future -     Hepatic function panel -     TSH -     Lipid panel  Hyperglycemia- His A1c is at 6.2%.  He is prediabetic. -     Basic metabolic panel; Future -     Hepatic function panel; Future -     Hemoglobin A1c; Future -     Hemoglobin A1c -     Hepatic function panel -     Basic metabolic panel  Encounter for general adult medical examination with abnormal findings- Exam completed, labs reviewed, vaccines reviewed - He refused a flu vaccine, cancer screenings are up-to-date, patient education was given.  Obesity (BMI 30.0-34.9) -     TSH; Future -     Hemoglobin A1c; Future -     Hemoglobin A1c -     TSH  Abnormal electrocardiogram (ECG) (EKG) -     Cancel: MYOCARDIAL PERFUSION IMAGING; Future -     Cancel: Cardiac Stress Test: Informed Consent Details: Physician/Practitioner Attestation; Transcribe to consent form and obtain patient signature; Future -     Cancel: CT CARDIAC SCORING (SELF PAY ONLY); Future -     CT CARDIAC SCORING ([facility] LOCATIONS ONLY); Future  Other orders -     Zoster Recombinant (Shingrix ) -     LDL cholesterol, direct   I am having Brandon Ramirez maintain his cetirizine, multivitamin, Vitamin D, Multiple Vitamins-Minerals (ZINC PO), Ascorbic Acid (VITAMIN C PO), acetaminophen, Melatonin, calcium carbonate, sildenafil, and EPINEPHrine.  No orders of the defined types  were placed in this encounter.    Follow-up: Return in about 6 months (around 02/05/2023).  Sanda Linger, MD

## 2022-08-11 ENCOUNTER — Encounter: Payer: Self-pay | Admitting: Internal Medicine

## 2022-08-22 ENCOUNTER — Other Ambulatory Visit: Payer: 59

## 2022-10-08 ENCOUNTER — Encounter: Payer: Self-pay | Admitting: Internal Medicine

## 2022-10-09 ENCOUNTER — Encounter: Payer: Self-pay | Admitting: Internal Medicine

## 2022-10-10 ENCOUNTER — Ambulatory Visit
Admission: RE | Admit: 2022-10-10 | Discharge: 2022-10-10 | Disposition: A | Discharge status: Home / Self Care | Payer: No Typology Code available for payment source | Source: Ambulatory Visit | Attending: Internal Medicine | Admitting: Internal Medicine

## 2022-10-10 DIAGNOSIS — R9431 Abnormal electrocardiogram [ECG] [EKG]: Secondary | ICD-10-CM

## 2022-10-10 DIAGNOSIS — R0609 Other forms of dyspnea: Secondary | ICD-10-CM

## 2022-10-15 ENCOUNTER — Ambulatory Visit (INDEPENDENT_AMBULATORY_CARE_PROVIDER_SITE_OTHER): Payer: PRIVATE HEALTH INSURANCE

## 2022-10-15 DIAGNOSIS — I442 Atrioventricular block, complete: Secondary | ICD-10-CM | POA: Diagnosis not present

## 2022-10-16 LAB — CUP PACEART REMOTE DEVICE CHECK
Battery Remaining Longevity: 132 mo
Battery Voltage: 3.03 V
Brady Statistic AP VP Percent: 3.35 %
Brady Statistic AP VS Percent: 0 %
Brady Statistic AS VP Percent: 96.59 %
Brady Statistic AS VS Percent: 0.06 %
Brady Statistic RA Percent Paced: 3.36 %
Brady Statistic RV Percent Paced: 99.94 %
Date Time Interrogation Session: 20231217231916
Implantable Lead Connection Status: 753985
Implantable Lead Connection Status: 753985
Implantable Lead Implant Date: 20191018
Implantable Lead Implant Date: 20220318
Implantable Lead Location: 753859
Implantable Lead Location: 753860
Implantable Lead Model: 5076
Implantable Lead Model: 5076
Implantable Pulse Generator Implant Date: 20220318
Lead Channel Impedance Value: 361 Ohm
Lead Channel Impedance Value: 361 Ohm
Lead Channel Impedance Value: 494 Ohm
Lead Channel Impedance Value: 589 Ohm
Lead Channel Pacing Threshold Amplitude: 0.75 V
Lead Channel Pacing Threshold Amplitude: 0.875 V
Lead Channel Pacing Threshold Pulse Width: 0.4 ms
Lead Channel Pacing Threshold Pulse Width: 0.4 ms
Lead Channel Sensing Intrinsic Amplitude: 3.25 mV
Lead Channel Sensing Intrinsic Amplitude: 3.25 mV
Lead Channel Sensing Intrinsic Amplitude: 9.875 mV
Lead Channel Sensing Intrinsic Amplitude: 9.875 mV
Lead Channel Setting Pacing Amplitude: 1.5 V
Lead Channel Setting Pacing Amplitude: 2 V
Lead Channel Setting Pacing Pulse Width: 0.4 ms
Lead Channel Setting Sensing Sensitivity: 0.9 mV
Zone Setting Status: 755011

## 2022-11-21 NOTE — Progress Notes (Signed)
Remote pacemaker transmission.   

## 2023-01-14 ENCOUNTER — Ambulatory Visit (INDEPENDENT_AMBULATORY_CARE_PROVIDER_SITE_OTHER): Payer: PRIVATE HEALTH INSURANCE

## 2023-01-14 DIAGNOSIS — I442 Atrioventricular block, complete: Secondary | ICD-10-CM | POA: Diagnosis not present

## 2023-01-16 LAB — CUP PACEART REMOTE DEVICE CHECK
Battery Remaining Longevity: 130 mo
Battery Voltage: 3.02 V
Brady Statistic AP VP Percent: 3.01 %
Brady Statistic AP VS Percent: 0 %
Brady Statistic AS VP Percent: 96.91 %
Brady Statistic AS VS Percent: 0.08 %
Brady Statistic RA Percent Paced: 3.02 %
Brady Statistic RV Percent Paced: 99.92 %
Date Time Interrogation Session: 20240317205511
Implantable Lead Connection Status: 753985
Implantable Lead Connection Status: 753985
Implantable Lead Implant Date: 20191018
Implantable Lead Implant Date: 20220318
Implantable Lead Location: 753859
Implantable Lead Location: 753860
Implantable Lead Model: 5076
Implantable Lead Model: 5076
Implantable Pulse Generator Implant Date: 20220318
Lead Channel Impedance Value: 361 Ohm
Lead Channel Impedance Value: 380 Ohm
Lead Channel Impedance Value: 513 Ohm
Lead Channel Impedance Value: 608 Ohm
Lead Channel Pacing Threshold Amplitude: 0.75 V
Lead Channel Pacing Threshold Amplitude: 0.75 V
Lead Channel Pacing Threshold Pulse Width: 0.4 ms
Lead Channel Pacing Threshold Pulse Width: 0.4 ms
Lead Channel Sensing Intrinsic Amplitude: 2.75 mV
Lead Channel Sensing Intrinsic Amplitude: 2.75 mV
Lead Channel Sensing Intrinsic Amplitude: 9.875 mV
Lead Channel Sensing Intrinsic Amplitude: 9.875 mV
Lead Channel Setting Pacing Amplitude: 1.75 V
Lead Channel Setting Pacing Amplitude: 2 V
Lead Channel Setting Pacing Pulse Width: 0.4 ms
Lead Channel Setting Sensing Sensitivity: 0.9 mV
Zone Setting Status: 755011

## 2023-02-17 NOTE — Progress Notes (Unsigned)
Cardiology Office Note Date:  02/20/2023  Patient ID:  Brandon Ramirez, Brandon Ramirez 06-03-1961, MRN [id number] PCP:  Brandon Grandchild, MD  Electrophysiologist: Dr. Ladona Ridgel    Chief Complaint:  annual visit  History of Present Illness: Brandon Ramirez is a 62 y.o. male with history of CHB w/PPM, obesity, PPM induced LBBB  Saw Dr. Ladona Ridgel 04/19/21, recently had undergone RV lead (HIS) revision a few months prior, feeling well, good exertional capacity.  Encouraged weight loss.  TODAY Doing well physically, having some personal/family issues that are stressful No CP, palpitations, cardiac awareness No near syncope or syncope. Generally sedentary but no physical limitations, cut a tree down the other weekend, no DOE Follows closely with PMD Working on weight loss Has adjusted diet and hopefully will start to make strides in this  Usually home numbers when he checks infrequently are better on his BP   Device information MDT dual chamber PPM implanted 08/15/2018 RV lead in the HIS position developed increasing thresholds Underwent RV lead extraction 01/14/21 RV apex and gen change   Past Medical History:  Diagnosis Date   Abnormal EKG 08/14/2018   Asthma    Chronic lower back pain    "herniated disc between L3-4" (08/14/2018)   Depression    GERD (gastroesophageal reflux disease)    Headache    Migraine once a year   Heart block atrioventricular 08/14/2018   History of kidney stones    Intervertebral disc disorder with radiculopathy of lumbar region 12/21/2016   Left bundle branch block 10/28/2012   Obesity (BMI 30.0-34.9) 01/22/2014   Other and unspecified hyperlipidemia 10/29/2012   Presence of permanent cardiac pacemaker 01/13/2021   Medtronic dual-chamber pacemaker, serial [id number]   Seasonal allergies    Shortness of breath    Stress at home    "related to custody" (08/14/2018)    Past Surgical History:  Procedure Laterality Date   LEFT HEART CATH AND CORONARY  ANGIOGRAPHY N/A 08/15/2018   Procedure: LEFT HEART CATH AND CORONARY ANGIOGRAPHY;  Surgeon: Yvonne Kendall, MD;  Location: [facility];  Service: Cardiovascular;  Laterality: N/A;   PACEMAKER IMPLANT N/A 08/15/2018   Medtronic Azure XT MRI conditional dual-chamber pacemaker with 3830 His Bundle lead implanted by Dr Johney Frame for 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     Ascorbic Acid (VITAMIN C PO) Take 1 tablet by mouth daily as needed (immune support).     calcium carbonate (TUMS - DOSED IN MG ELEMENTAL CALCIUM) 500 MG chewable tablet Chew 1,000-1,500 mg by mouth daily as needed for indigestion or heartburn.     cetirizine (ZYRTEC) 10 MG tablet Take 10 mg by mouth daily as needed for allergies.     clobetasol ointment (TEMOVATE) 0.05 % Apply 1 Application topically 2 (two) times daily. 60 g 0   Insulin Pen Needle 32G X 6 MM MISC 1 Act by Does not apply route once a week. 30 each 1   Melatonin 5 MG CAPS Take 5-10 mg by mouth at bedtime as needed (sleep).     Multiple  Vitamins-Minerals (ZINC PO) Take 1 tablet by mouth daily.     multivitamin (ONE-A-DAY MEN'S) TABS tablet Take 1 tablet by mouth daily.     predniSONE (DELTASONE) 10 MG tablet Take 3 tablets (30 mg total) by mouth 2 (two) times daily with a meal for 3 days. 18 tablet 0   predniSONE (DELTASONE) 2.5 MG tablet Take 3 tablets (7.5 mg total) by mouth 2 (two) times daily with a meal for 3 days. 18 tablet 0   [START ON 02/22/2023] predniSONE (DELTASONE) 5 MG tablet Take 3 tablets (15 mg total) by mouth 2  (two) times daily with a meal for 3 days. 18 tablet 0   Semaglutide-Weight Management 0.25 MG/0.5ML SOAJ Inject 0.25 mg into the skin once a week for 28 days. 2 mL 0   [START ON 03/19/2023] Semaglutide-Weight Management 0.5 MG/0.5ML SOAJ Inject 0.5 mg into the skin once a week for 28 days. 2 mL 0   [START ON 04/17/2023] Semaglutide-Weight Management 1 MG/0.5ML SOAJ Inject 1 mg into the skin once a week for 28 days. 2 mL 0   [START ON 05/16/2023] Semaglutide-Weight Management 1.7 MG/0.75ML SOAJ Inject 1.7 mg into the skin once a week for 28 days. 3 mL 0   [START ON 06/14/2023] Semaglutide-Weight Management 2.4 MG/0.75ML SOAJ Inject 2.4 mg into the skin once a week for 28 days. 3 mL 0   sildenafil (VIAGRA) 25 MG tablet Take 25 mg by mouth daily as needed for erectile dysfunction.     No current facility-administered medications for this visit.    Allergies:   Shellfish allergy   Social History:  The patient  reports that he has never smoked. He has never used smokeless tobacco. He reports current alcohol use of about 3.0 standard drinks of alcohol per week. He reports that he does not use drugs.   Family History:  The patient's family history includes Heart Problems in his father.  ROS:  Please see the history of present illness.    All other systems are reviewed and otherwise negative.   PHYSICAL EXAM:  VS:  BP (!) 120/90   Pulse 91   Ht  (1.753 m)   Wt 221 lb 9.6 oz (100.5 kg)   SpO2 96%   BMI 32.72 kg/m  BMI: Body mass index is 32.72 kg/m. Well nourished, well developed, in no acute distress HEENT: normocephalic, atraumatic Neck: no JVD, carotid bruits or masses Cardiac:  RRR; no significant murmurs, no rubs, or gallops Lungs:  CTA b/l, no wheezing, rhonchi or rales Abd: soft, nontender MS: no deformity or atrophy Ext: no edema Skin: warm and dry, no rash Neuro:  No gross deficits appreciated Psych: euthymic mood, full affect  PPM site is stable, no tethering or  discomfort   EKG:  not done today  Device interrogation done today and reviewed by myself:  Battery and lead measurements are good One NSVT last summer   10/10/22: Coronary Ca++ score IMPRESSION: Focal atherosclerotic calcification about proximal left anterior descending coronary artery. The observed calcium score of 3 is at the thirty-third percentile for subjects of the same age, gender, and race/ethnicity who are free of clinical cardiovascular disease and treated diabetes.  08/15/2018: TTE Left ventricle: The cavity size was normal. Wall thickness was    normal. Systolic function was normal. The estimated ejection    fraction was in the range of 60% to 65%. Wall motion was normal;    there were no regional wall motion abnormalities. Left  ventricular diastolic function parameters were normal.  - Left atrium: The atrium was normal in size.  - Right ventricle: The cavity size was normal. Wall thickness was    normal. Pacer wire or catheter noted in right ventricle. Systolic    function was normal.  - Right atrium: The atrium was normal in size. Pacer wire or    catheter noted in right atrium.  - Inferior vena cava: The vessel was normal in size. The    respirophasic diameter changes were in the normal range (>= 50%),    consistent with normal central venous pressure.      Recent Labs: 08/06/2022: ALT 27; Hemoglobin 15.2; Platelets 202.0; Pro B Natriuretic peptide (BNP) 23.0; TSH 3.02 02/18/2023: BUN 28; Creatinine, Ser 1.50; Potassium 4.5; Sodium 138  08/06/2022: Cholesterol 206; Direct LDL 141.0; HDL 37.80; Total CHOL/HDL Ratio 5; Triglycerides 234.0; VLDL 46.8   Estimated Creatinine Clearance: 60.4 mL/min (by C-G formula based on SCr of 1.5 mg/dL).   Wt Readings from Last 3 Encounters:  02/20/23 221 lb 9.6 oz (100.5 kg)  02/18/23 221 lb (100.2 kg)  08/06/22 225 lb (102.1 kg)     Other studies reviewed: Additional studies/records reviewed today include: summarized  above  ASSESSMENT AND PLAN:  PPM Intact function No programming changes made He is dependent  2. [place]he[place] 120[address] Better at home Increased stressors today Weight loss will help Advised low sodium and exercise   Disposition: F/u with remotes as usual, in clinic in a year again, sooner if needed  Current medicines are reviewed at length with the patient today.  The patient did not have any concerns regarding medicines.  Norma Fredrickson, PA-C 02/20/2023 8:38 AM     [facility] [address] [place] [address] [phone] (office)  [phone] (fax)

## 2023-02-18 ENCOUNTER — Ambulatory Visit (INDEPENDENT_AMBULATORY_CARE_PROVIDER_SITE_OTHER): Payer: PRIVATE HEALTH INSURANCE | Admitting: Internal Medicine

## 2023-02-18 ENCOUNTER — Encounter: Payer: Self-pay | Admitting: Internal Medicine

## 2023-02-18 VITALS — BP 114/62 | HR 68 | Temp 98.1°F | Ht 68.5 in | Wt 221.0 lb

## 2023-02-18 DIAGNOSIS — Z6833 Body mass index (BMI) 33.0-33.9, adult: Secondary | ICD-10-CM

## 2023-02-18 DIAGNOSIS — N182 Chronic kidney disease, stage 2 (mild): Secondary | ICD-10-CM | POA: Diagnosis not present

## 2023-02-18 DIAGNOSIS — Z125 Encounter for screening for malignant neoplasm of prostate: Secondary | ICD-10-CM | POA: Diagnosis not present

## 2023-02-18 DIAGNOSIS — L237 Allergic contact dermatitis due to plants, except food: Secondary | ICD-10-CM | POA: Diagnosis not present

## 2023-02-18 DIAGNOSIS — R7303 Prediabetes: Secondary | ICD-10-CM

## 2023-02-18 DIAGNOSIS — E66811 Other obesity due to excess calories: Secondary | ICD-10-CM

## 2023-02-18 DIAGNOSIS — M170 Bilateral primary osteoarthritis of knee: Secondary | ICD-10-CM | POA: Diagnosis not present

## 2023-02-18 DIAGNOSIS — E6609 Other obesity due to excess calories: Secondary | ICD-10-CM

## 2023-02-18 DIAGNOSIS — Z1211 Encounter for screening for malignant neoplasm of colon: Secondary | ICD-10-CM

## 2023-02-18 LAB — HEMOGLOBIN A1C: Hgb A1c MFr Bld: 6.1 % (ref 4.6–6.5)

## 2023-02-18 LAB — BASIC METABOLIC PANEL
BUN: 28 mg/dL — ABNORMAL HIGH (ref 6–23)
CO2: 25 mEq/L (ref 19–32)
Calcium: 9.6 mg/dL (ref 8.4–10.5)
Chloride: 102 mEq/L (ref 96–112)
Creatinine, Ser: 1.5 mg/dL (ref 0.40–1.50)
GFR: 49.99 mL/min — ABNORMAL LOW (ref >60.00)
Glucose, Bld: 132 mg/dL — ABNORMAL HIGH (ref 70–99)
Potassium: 4.5 mEq/L (ref 3.5–5.1)
Sodium: 138 mEq/L (ref 135–145)

## 2023-02-18 LAB — PSA: PSA: 1.25 ng/mL (ref 0.10–4.00)

## 2023-02-18 MED ORDER — SEMAGLUTIDE-WEIGHT MANAGEMENT 0.5 MG/0.5ML ~~LOC~~ SOAJ
0.5000 mg | SUBCUTANEOUS | 0 refills | Status: AC
Start: 2023-03-19 — End: 2023-04-16

## 2023-02-18 MED ORDER — PREDNISONE 2.5 MG PO TABS: 7.5000 mg | ORAL_TABLET | Freq: Two times a day (BID) | ORAL | 0 refills | Status: AC

## 2023-02-18 MED ORDER — INSULIN PEN NEEDLE 32G X 6 MM MISC
1.0000 | 1 refills | Status: AC
Start: 2023-02-18 — End: ?

## 2023-02-18 MED ORDER — PREDNISONE 10 MG PO TABS: 30.0000 mg | ORAL_TABLET | Freq: Two times a day (BID) | ORAL | 0 refills | Status: AC

## 2023-02-18 MED ORDER — SEMAGLUTIDE-WEIGHT MANAGEMENT 1.7 MG/0.75ML ~~LOC~~ SOAJ
1.7000 mg | SUBCUTANEOUS | 0 refills | Status: AC
Start: 2023-05-16 — End: 2023-06-13

## 2023-02-18 MED ORDER — SEMAGLUTIDE-WEIGHT MANAGEMENT 2.4 MG/0.75ML ~~LOC~~ SOAJ
2.4000 mg | SUBCUTANEOUS | 0 refills | Status: AC
Start: 2023-06-14 — End: 2023-07-12

## 2023-02-18 MED ORDER — PREDNISONE 5 MG PO TABS: 15.0000 mg | ORAL_TABLET | Freq: Two times a day (BID) | ORAL | 0 refills | Status: AC

## 2023-02-18 MED ORDER — SEMAGLUTIDE-WEIGHT MANAGEMENT 0.25 MG/0.5ML ~~LOC~~ SOAJ
0.2500 mg | SUBCUTANEOUS | 0 refills | Status: AC
Start: 2023-02-18 — End: 2023-03-18

## 2023-02-18 MED ORDER — SEMAGLUTIDE-WEIGHT MANAGEMENT 1 MG/0.5ML ~~LOC~~ SOAJ
1.0000 mg | SUBCUTANEOUS | 0 refills | Status: AC
Start: 2023-04-17 — End: 2023-05-15

## 2023-02-18 MED ORDER — CLOBETASOL PROPIONATE 0.05 % EX OINT: 1.0000 | TOPICAL_OINTMENT | Freq: Two times a day (BID) | CUTANEOUS | 0 refills | Status: AC

## 2023-02-18 NOTE — Progress Notes (Signed)
Subjective:  Patient ID: Brandon Ramirez, male    DOB: 1961-07-28  Age: 62 y.o. MRN: [id number]  CC: Osteoarthritis and Rash   HPI Brandon Ramirez presents for f/up --  He complains of an itchy rash on his right lower back and left groin for 2 weeks.  He says that he noticed the rash after removing vines from a tree.  He has been applying topical Benadryl without much relief from his symptoms.  He wants to start using semaglutide.  He is active and denies chest pain, shortness of breath, diaphoresis, or edema.  Outpatient Medications Prior to Visit  Medication Sig Dispense Refill   acetaminophen (TYLENOL) 500 MG tablet Take 500 mg by mouth every 6 (six) hours as needed for moderate pain or headache.     Ascorbic Acid (VITAMIN C PO) Take 1 tablet by mouth daily as needed (immune support).     calcium carbonate (TUMS - DOSED IN MG ELEMENTAL CALCIUM) 500 MG chewable tablet Chew 1,000-1,500 mg by mouth daily as needed for indigestion or heartburn.     cetirizine (ZYRTEC) 10 MG tablet Take 10 mg by mouth daily as needed for allergies.     Cholecalciferol (VITAMIN D) 50 MCG (2000 UT) tablet Take 4,000 Units by mouth daily.     EPINEPHrine 0.3 mg/0.3 mL IJ SOAJ injection Inject 0.3 mg into the muscle as needed for anaphylaxis. 2 each 3   Melatonin 5 MG CAPS Take 5-10 mg by mouth at bedtime as needed (sleep).     Multiple Vitamins-Minerals (ZINC PO) Take 1 tablet by mouth daily.     multivitamin (ONE-A-DAY MEN'S) TABS tablet Take 1 tablet by mouth daily.     sildenafil (VIAGRA) 25 MG tablet Take 25 mg by mouth daily as needed for erectile dysfunction.     No facility-administered medications prior to visit.    ROS Review of Systems  Constitutional: Negative.  Negative for diaphoresis, fatigue and fever.  HENT: Negative.    Eyes: Negative.   Respiratory:  Negative for cough, chest tightness, shortness of breath and wheezing.   Cardiovascular:  Negative for chest pain, palpitations and leg  swelling.  Gastrointestinal:  Negative for abdominal pain, constipation, diarrhea, nausea and vomiting.  Endocrine: Negative.   Genitourinary: Negative.  Negative for difficulty urinating.  Musculoskeletal:  Positive for arthralgias. Negative for back pain, myalgias and neck pain.  Neurological:  Negative for dizziness and weakness.  Hematological:  Negative for adenopathy. Does not bruise/bleed easily.  Psychiatric/Behavioral: Negative.      Objective:  BP 114/62 (BP Location: Left Arm, Patient Position: Sitting, Cuff Size: Large)   Pulse 68   Temp 98.1 F (36.7 C) (Oral)   Ht 5' 8.5" (1.74 m)   Wt 221 lb (100.2 kg)   SpO2 92%   BMI 33.11 kg/m   BP Readings from Last 3 Encounters:  02/18/23 114/62  08/06/22 120/84  06/21/21 118/66    Wt Readings from Last 3 Encounters:  02/18/23 221 lb (100.2 kg)  08/06/22 225 lb (102.1 kg)  06/21/21 220 lb (99.8 kg)    Physical Exam Vitals reviewed.  Constitutional:      Appearance: Normal appearance.  HENT:     Nose: Nose normal.     Mouth/Throat:     Mouth: Mucous membranes are moist.  Eyes:     General: No scleral icterus.    Conjunctiva/sclera: Conjunctivae normal.  Cardiovascular:     Rate and Rhythm: Normal rate and regular rhythm.  Heart sounds: No murmur heard. Pulmonary:     Effort: Pulmonary effort is normal.     Breath sounds: No stridor. No wheezing, rhonchi or rales.  Abdominal:     General: Abdomen is protuberant. Bowel sounds are normal. There is no distension.     Palpations: Abdomen is soft. There is no hepatomegaly, splenomegaly or mass.     Tenderness: There is no abdominal tenderness.  Musculoskeletal:        General: Normal range of motion.     Cervical back: Neck supple.     Right lower leg: No edema.     Left lower leg: No edema.  Lymphadenopathy:     Cervical: No cervical adenopathy.  Skin:    General: Skin is warm and dry.     Findings: Erythema and rash present. No lesion. Rash is macular,  papular and scaling.  Neurological:     General: No focal deficit present.     Mental Status: He is alert. Mental status is at baseline.     Lab Results  Component Value Date   WBC 7.7 08/06/2022   HGB 15.2 08/06/2022   HCT 43.6 08/06/2022   PLT 202.0 08/06/2022   GLUCOSE 132 (H) 02/18/2023   CHOL 206 (H) 08/06/2022   TRIG 234.0 (H) 08/06/2022   HDL 37.80 (L) 08/06/2022   LDLDIRECT 141.0 08/06/2022   LDLCALC 93 08/15/2018   ALT 27 08/06/2022   AST 20 08/06/2022   NA 138 02/18/2023   K 4.5 02/18/2023   CL 102 02/18/2023   CREATININE 1.50 02/18/2023   BUN 28 (H) 02/18/2023   CO2 25 02/18/2023   TSH 3.02 08/06/2022   PSA 1.25 02/18/2023   HGBA1C 6.1 02/18/2023    CT CARDIAC SCORING ([facility] LOCATIONS ONLY)  Result Date: 10/10/2022 CLINICAL DATA:  62 year old Caucasian male with history of hyper cholesterolemia in chest pain. * Tracking Code: FCC * EXAM: CT CARDIAC CORONARY ARTERY CALCIUM SCORE TECHNIQUE: Non-contrast imaging through the heart was performed using prospective ECG gating. Image post processing was performed on an independent workstation, allowing for quantitative analysis of the heart and coronary arteries. Note that this exam targets the heart and the chest was not imaged in its entirety. COMPARISON:  None Available. FINDINGS: CORONARY CALCIUM SCORES: Left Main: 0 LAD: 3 LCx: 0 RCA: 0 Total Agatston Score: 3 MESA database percentile: Thirty-third AORTA MEASUREMENTS: Ascending Aorta: 31 mm Descending Aorta: 26 mm OTHER FINDINGS: Cardiovascular: The heart is normal in size. Partially visualized dual lead pacemaker/AICD in place with leads expected locations. Mediastinum/Nodes: Visualized esophagus and trachea within normal limits. No mediastinal lymphadenopathy. Lungs/Pleura: The lungs are clear bilaterally. Upper Abdomen: The visualized upper abdomen is within normal limits. Musculoskeletal: No acute osseous abnormality. IMPRESSION: Focal atherosclerotic calcification about  proximal left anterior descending coronary artery. The observed calcium score of 3 is at the thirty-third percentile for subjects of the same age, gender, and race/ethnicity who are free of clinical cardiovascular disease and treated diabetes. Marliss Coots, MD [facility] Electronically Signed   By: Marliss Coots M.D.   On: 10/10/2022 13:51    Assessment & Plan:   Prediabetes -     Hemoglobin A1c; Future -     Basic metabolic panel; Future  Allergic contact dermatitis due to plants, except food-will treat with topical and systemic steroids. -     predniSONE; Take 3 tablets (15 mg total) by mouth 2 (two) times daily with a meal for 3 days.  Dispense: 18 tablet; Refill: 0 -     predniSONE; Take 3 tablets (7.5 mg total) by mouth 2 (two) times daily with a meal for 3 days.  Dispense: 18 tablet; Refill: 0 -     predniSONE; Take 3 tablets (30 mg total) by mouth 2 (two) times daily with a meal for 3 days.  Dispense: 18 tablet; Refill: 0 -     Clobetasol Propionate; Apply 1 Application topically 2 (two) times daily.  Dispense: 60 g; Refill: 0  Prostate cancer screening -     PSA; Future  Screen for colon cancer -     Cologuard  Primary osteoarthritis of both knees  Chronic kidney disease, stage 2, mildly decreased GFR- I have asked him to avoid nephrotoxic agents.  Class 1 obesity due to excess calories with serious comorbidity and body mass index (BMI) of 33.0 to 33.9 in adult -     Semaglutide-Weight Management; Inject 0.25 mg into the skin once a week for 28 days.  Dispense: 2 mL; Refill: 0 -     Semaglutide-Weight Management; Inject 0.5 mg into the skin once a week for 28 days.  Dispense: 2 mL; Refill: 0 -     Semaglutide-Weight Management; Inject 1 mg into the skin once a week for 28 days.  Dispense: 2 mL; Refill: 0 -     Semaglutide-Weight Management; Inject 1.7 mg into the skin once a week for 28 days.  Dispense: 3 mL; Refill:  0 -     Semaglutide-Weight Management; Inject 2.4 mg into the skin once a week for 28 days.  Dispense: 3 mL; Refill: 0 -     Insulin Pen Needle; 1 Act by Does not apply route once a week.  Dispense: 30 each; Refill: 1     Follow-up: Return in about 6 months (around 08/20/2023).  Sanda Linger, MD

## 2023-02-18 NOTE — Patient Instructions (Signed)
Contact Dermatitis Dermatitis is redness, soreness, and swelling (inflammation) of the skin. Contact dermatitis is a reaction to certain substances that touch the skin. There are two types of this condition: Irritant contact dermatitis. This is the most common type. It happens when something irritates your skin, such as when your hands get dry from washing them too often with soap. You can get this type of reaction even if you have not been exposed to the irritant before. Allergic contact dermatitis. This type is caused by a substance that you are allergic to, such as poison ivy. It occurs when you have been exposed to the substance (allergen) and form a sensitivity to it. In some cases, the reaction may start soon after your first exposure to the allergen. In other cases, it may not start until you are exposed to the allergen again. It may then occur every time you are exposed to the allergen in the future. What are the causes? Irritant contact dermatitis is often caused by exposure to: Makeup. Soaps, detergents, and bleaches. Acids. Metal salts, such as nickel. Allergic contact dermatitis is often caused by exposure to: Poisonous plants. Chemicals. Jewelry. Latex. Medicines. Preservatives in products, such as clothes. What increases the risk? You are more likely to get this condition if you have: A job that exposes you to irritants or allergens. Certain medical conditions. These include asthma and eczema. What are the signs or symptoms? Symptoms of this condition may occur in any place on your body that has been touched by the irritant. Symptoms include: Dryness, flaking, or cracking. Redness. Itching. Pain or a burning feeling. Blisters. Drainage of small amounts of blood or clear fluid from skin cracks. With allergic contact dermatitis, there may also be swelling in areas such as the eyelids, mouth, or genitals. How is this diagnosed? This condition is diagnosed with a medical  history and physical exam. A patch skin test may be done to help figure out the cause. If the condition is related to your job, you may need to see an expert in health problems in the workplace (occupational medicine specialist). How is this treated? This condition is treated by staying away from the cause of the reaction and protecting your skin from further contact. Treatment may also include: Steroid creams or ointments. Steroid medicines may need be taken by mouth (orally) in more severe cases. Antibiotics or medicines applied to the skin to kill bacteria (antibacterial ointments). These may be needed if a skin infection is present. Antihistamines. These may be taken orally or put on as a lotion to ease itching. A bandage (dressing). Follow these instructions at home: Skin care Moisturize your skin as needed. Put cool, wet cloths (cool compresses) on the affected areas. Try applying baking soda paste to your skin. Stir water into baking soda until it has the consistency of a paste. Do not scratch your skin. Avoid friction to the affected area. Avoid the use of soaps, perfumes, and dyes. Check the affected areas every day for signs of infection. Check for: More redness, swelling, or pain. More fluid or blood. Warmth. Pus or a bad smell. Medicines Take or apply over-the-counter and prescription medicines only as told by your health care provider. If you were prescribed antibiotics, take or apply them as told by your health care provider. Do not stop using the antibiotic even if you start to feel better. Bathing Try taking a bath with: Epsom salts. Follow the instructions on the packaging. You can get these at your local pharmacy   or grocery store. Baking soda. Pour a small amount into the bath as told by your health care provider. Colloidal oatmeal. Follow the instructions on the packaging. You can get this at your local pharmacy or grocery store. Bathe less often. This may mean bathing  every other day. Bathe in lukewarm water. Avoid using hot water. Bandage care If you were given a dressing, change it as told by your health care provider. Wash your hands with soap and water for at least 20 seconds before and after you change your dressing. If soap and water are not available, use hand sanitizer. General instructions Avoid the substance that caused your reaction. If you do not know what caused it, keep a journal to try to track what caused it. Write down: What you eat and drink. What cosmetics you use. What you wear in the affected area. This includes jewelry. Contact a health care provider if: Your condition does not get better with treatment. Your condition gets worse. You have any signs of infection. You have a fever. You have new symptoms. Your bone or joint under the affected area becomes painful after the skin has healed. Get help right away if: You notice red streaks coming from the affected area. The affected area turns darker. You have trouble breathing. This information is not intended to replace advice given to you by your health care provider. Make sure you discuss any questions you have with your health care provider. Document Revised: 04/20/2022 Document Reviewed: 04/20/2022 Elsevier Patient Education  2023 Elsevier Inc.  

## 2023-02-20 ENCOUNTER — Encounter: Payer: Self-pay | Admitting: Physician Assistant

## 2023-02-20 ENCOUNTER — Ambulatory Visit: Payer: PRIVATE HEALTH INSURANCE | Attending: Physician Assistant | Admitting: Physician Assistant

## 2023-02-20 VITALS — BP 120/90 | HR 91 | Ht 69.0 in | Wt 221.6 lb

## 2023-02-20 DIAGNOSIS — Z950 Presence of cardiac pacemaker: Secondary | ICD-10-CM

## 2023-02-20 LAB — CUP PACEART INCLINIC DEVICE CHECK
Battery Remaining Longevity: 133 mo
Battery Voltage: 3.02 V
Brady Statistic AP VP Percent: 2.91 %
Brady Statistic AP VS Percent: 0 %
Brady Statistic AS VP Percent: 97.05 %
Brady Statistic AS VS Percent: 0.04 %
Brady Statistic RA Percent Paced: 2.91 %
Brady Statistic RV Percent Paced: 99.96 %
Date Time Interrogation Session: 20240424125653
Implantable Lead Connection Status: 753985
Implantable Lead Connection Status: 753985
Implantable Lead Implant Date: 20191018
Implantable Lead Implant Date: 20220318
Implantable Lead Location: 753859
Implantable Lead Location: 753860
Implantable Lead Model: 5076
Implantable Lead Model: 5076
Implantable Pulse Generator Implant Date: 20220318
Lead Channel Impedance Value: 418 Ohm
Lead Channel Impedance Value: 475 Ohm
Lead Channel Impedance Value: 608 Ohm
Lead Channel Impedance Value: 665 Ohm
Lead Channel Pacing Threshold Amplitude: 0.625 V
Lead Channel Pacing Threshold Amplitude: 0.75 V
Lead Channel Pacing Threshold Pulse Width: 0.4 ms
Lead Channel Pacing Threshold Pulse Width: 0.4 ms
Lead Channel Sensing Intrinsic Amplitude: 1 mV
Lead Channel Sensing Intrinsic Amplitude: 6.125 mV
Lead Channel Sensing Intrinsic Amplitude: 9.875 mV
Lead Channel Sensing Intrinsic Amplitude: 9.875 mV
Lead Channel Setting Pacing Amplitude: 1.5 V
Lead Channel Setting Pacing Amplitude: 2 V
Lead Channel Setting Pacing Pulse Width: 0.4 ms
Lead Channel Setting Sensing Sensitivity: 0.9 mV
Zone Setting Status: 755011

## 2023-02-20 NOTE — Patient Instructions (Signed)
Medication Instructions:   Your physician recommends that you continue on your current medications as directed. Please refer to the Current Medication list given to you today.  *If you need a refill on your cardiac medications before your next appointment, please call your pharmacy*   Lab Work:  NONE ORDERED  TODAY    If you have labs (blood work) drawn today and your tests are completely normal, you will receive your results only by: MyChart Message (if you have MyChart) OR A paper copy in the mail If you have any lab test that is abnormal or we need to change your treatment, we will call you to review the results.   Testing/Procedures: NONE ORDERED  TODAY      Follow-Up: At [facility], you and your health needs are our priority.  As part of our continuing mission to provide you with exceptional heart care, we have created designated Provider Care Teams.  These Care Teams include your primary Cardiologist (physician) and Advanced Practice Providers (APPs -  Physician Assistants and Nurse Practitioners) who all work together to provide you with the care you need, when you need it.  We recommend signing up for the patient portal called "MyChart".  Sign up information is provided on this After Visit Summary.  MyChart is used to connect with patients for Virtual Visits (Telemedicine).  Patients are able to view lab/test results, encounter notes, upcoming appointments, etc.  Non-urgent messages can be sent to your provider as well.   To learn more about what you can do with MyChart, go to [email or web address].    Your next appointment:   1 year(s)  Provider:   Gregg Taylor, MD    Other Instructions  

## 2023-02-25 NOTE — Progress Notes (Signed)
Remote pacemaker transmission.   

## 2023-04-04 ENCOUNTER — Telehealth: Payer: Self-pay | Admitting: Internal Medicine

## 2023-04-04 NOTE — Telephone Encounter (Signed)
I spoke with the patient and he will keep the appointment.

## 2023-04-04 NOTE — Telephone Encounter (Signed)
Patient would like to reschedule his device check on 6/17 because he will be out of town. No answer from [facility] at this time.

## 2023-04-15 ENCOUNTER — Ambulatory Visit (INDEPENDENT_AMBULATORY_CARE_PROVIDER_SITE_OTHER): Payer: PRIVATE HEALTH INSURANCE

## 2023-04-15 DIAGNOSIS — I442 Atrioventricular block, complete: Secondary | ICD-10-CM

## 2023-04-16 LAB — CUP PACEART REMOTE DEVICE CHECK
Battery Remaining Longevity: 128 mo
Battery Voltage: 3.02 V
Brady Statistic AP VP Percent: 1.57 %
Brady Statistic AP VS Percent: 0 %
Brady Statistic AS VP Percent: 98.39 %
Brady Statistic AS VS Percent: 0.04 %
Brady Statistic RA Percent Paced: 1.57 %
Brady Statistic RV Percent Paced: 99.96 %
Date Time Interrogation Session: 20240616234531
Implantable Lead Connection Status: 753985
Implantable Lead Connection Status: 753985
Implantable Lead Implant Date: 20191018
Implantable Lead Implant Date: 20220318
Implantable Lead Location: 753859
Implantable Lead Location: 753860
Implantable Lead Model: 5076
Implantable Lead Model: 5076
Implantable Pulse Generator Implant Date: 20220318
Lead Channel Impedance Value: 361 Ohm
Lead Channel Impedance Value: 380 Ohm
Lead Channel Impedance Value: 532 Ohm
Lead Channel Impedance Value: 589 Ohm
Lead Channel Pacing Threshold Amplitude: 0.75 V
Lead Channel Pacing Threshold Amplitude: 0.875 V
Lead Channel Pacing Threshold Pulse Width: 0.4 ms
Lead Channel Pacing Threshold Pulse Width: 0.4 ms
Lead Channel Sensing Intrinsic Amplitude: 3.5 mV
Lead Channel Sensing Intrinsic Amplitude: 3.5 mV
Lead Channel Sensing Intrinsic Amplitude: 7.25 mV
Lead Channel Sensing Intrinsic Amplitude: 7.25 mV
Lead Channel Setting Pacing Amplitude: 1.5 V
Lead Channel Setting Pacing Amplitude: 2 V
Lead Channel Setting Pacing Pulse Width: 0.4 ms
Lead Channel Setting Sensing Sensitivity: 0.9 mV
Zone Setting Status: 755011

## 2023-05-06 NOTE — Progress Notes (Signed)
Remote pacemaker transmission.   

## 2023-07-15 ENCOUNTER — Ambulatory Visit (INDEPENDENT_AMBULATORY_CARE_PROVIDER_SITE_OTHER): Payer: PRIVATE HEALTH INSURANCE

## 2023-07-15 DIAGNOSIS — I442 Atrioventricular block, complete: Secondary | ICD-10-CM

## 2023-07-16 LAB — CUP PACEART REMOTE DEVICE CHECK
Battery Remaining Longevity: 124 mo
Battery Voltage: 3.02 V
Brady Statistic AP VP Percent: 5.58 %
Brady Statistic AP VS Percent: 0 %
Brady Statistic AS VP Percent: 94.39 %
Brady Statistic AS VS Percent: 0.03 %
Brady Statistic RA Percent Paced: 5.57 %
Brady Statistic RV Percent Paced: 99.97 %
Date Time Interrogation Session: 20240916063009
Implantable Lead Connection Status: 753985
Implantable Lead Connection Status: 753985
Implantable Lead Implant Date: 20191018
Implantable Lead Implant Date: 20220318
Implantable Lead Location: 753859
Implantable Lead Location: 753860
Implantable Lead Model: 5076
Implantable Lead Model: 5076
Implantable Pulse Generator Implant Date: 20220318
Lead Channel Impedance Value: 361 Ohm
Lead Channel Impedance Value: 361 Ohm
Lead Channel Impedance Value: 513 Ohm
Lead Channel Impedance Value: 589 Ohm
Lead Channel Pacing Threshold Amplitude: 0.875 V
Lead Channel Pacing Threshold Amplitude: 0.875 V
Lead Channel Pacing Threshold Pulse Width: 0.4 ms
Lead Channel Pacing Threshold Pulse Width: 0.4 ms
Lead Channel Sensing Intrinsic Amplitude: 3.25 mV
Lead Channel Sensing Intrinsic Amplitude: 3.25 mV
Lead Channel Sensing Intrinsic Amplitude: 7.25 mV
Lead Channel Sensing Intrinsic Amplitude: 7.25 mV
Lead Channel Setting Pacing Amplitude: 1.75 V
Lead Channel Setting Pacing Amplitude: 2 V
Lead Channel Setting Pacing Pulse Width: 0.4 ms
Lead Channel Setting Sensing Sensitivity: 0.9 mV
Zone Setting Status: 755011

## 2023-07-29 NOTE — Progress Notes (Signed)
Remote pacemaker transmission.   

## 2023-08-22 ENCOUNTER — Ambulatory Visit: Payer: PRIVATE HEALTH INSURANCE | Admitting: Internal Medicine

## 2023-10-14 ENCOUNTER — Ambulatory Visit (INDEPENDENT_AMBULATORY_CARE_PROVIDER_SITE_OTHER): Payer: PRIVATE HEALTH INSURANCE

## 2023-10-14 DIAGNOSIS — I442 Atrioventricular block, complete: Secondary | ICD-10-CM | POA: Diagnosis not present

## 2023-10-16 LAB — CUP PACEART REMOTE DEVICE CHECK
Battery Remaining Longevity: 121 mo
Battery Voltage: 3.02 V
Brady Statistic AP VP Percent: 4.24 %
Brady Statistic AP VS Percent: 0 %
Brady Statistic AS VP Percent: 95.74 %
Brady Statistic AS VS Percent: 0.02 %
Brady Statistic RA Percent Paced: 4.23 %
Brady Statistic RV Percent Paced: 99.98 %
Date Time Interrogation Session: 20241217212233
Implantable Lead Connection Status: 753985
Implantable Lead Connection Status: 753985
Implantable Lead Implant Date: 20191018
Implantable Lead Implant Date: 20220318
Implantable Lead Location: 753859
Implantable Lead Location: 753860
Implantable Lead Model: 5076
Implantable Lead Model: 5076
Implantable Pulse Generator Implant Date: 20220318
Lead Channel Impedance Value: 361 Ohm
Lead Channel Impedance Value: 361 Ohm
Lead Channel Impedance Value: 513 Ohm
Lead Channel Impedance Value: 551 Ohm
Lead Channel Pacing Threshold Amplitude: 0.625 V
Lead Channel Pacing Threshold Amplitude: 0.875 V
Lead Channel Pacing Threshold Pulse Width: 0.4 ms
Lead Channel Pacing Threshold Pulse Width: 0.4 ms
Lead Channel Sensing Intrinsic Amplitude: 2.75 mV
Lead Channel Sensing Intrinsic Amplitude: 2.75 mV
Lead Channel Sensing Intrinsic Amplitude: 8.375 mV
Lead Channel Sensing Intrinsic Amplitude: 8.375 mV
Lead Channel Setting Pacing Amplitude: 1.5 V
Lead Channel Setting Pacing Amplitude: 2 V
Lead Channel Setting Pacing Pulse Width: 0.4 ms
Lead Channel Setting Sensing Sensitivity: 0.9 mV
Zone Setting Status: 755011

## 2023-11-18 NOTE — Progress Notes (Signed)
Remote pacemaker transmission.   

## 2023-11-18 NOTE — Addendum Note (Signed)
Addended by: Geralyn Flash D on: 11/18/2023 04:33 PM   Modules accepted: Orders

## 2024-01-13 ENCOUNTER — Ambulatory Visit (INDEPENDENT_AMBULATORY_CARE_PROVIDER_SITE_OTHER): Payer: PRIVATE HEALTH INSURANCE

## 2024-01-13 DIAGNOSIS — I442 Atrioventricular block, complete: Secondary | ICD-10-CM

## 2024-01-14 ENCOUNTER — Encounter: Payer: Self-pay | Admitting: Internal Medicine

## 2024-01-14 LAB — CUP PACEART REMOTE DEVICE CHECK
Battery Remaining Longevity: 117 mo
Battery Voltage: 3.01 V
Brady Statistic AP VP Percent: 4.11 %
Brady Statistic AP VS Percent: 0 %
Brady Statistic AS VP Percent: 95.87 %
Brady Statistic AS VS Percent: 0.03 %
Brady Statistic RA Percent Paced: 4.1 %
Brady Statistic RV Percent Paced: 99.97 %
Date Time Interrogation Session: 20250317004738
Implantable Lead Connection Status: 753985
Implantable Lead Connection Status: 753985
Implantable Lead Implant Date: 20191018
Implantable Lead Implant Date: 20220318
Implantable Lead Location: 753859
Implantable Lead Location: 753860
Implantable Lead Model: 5076
Implantable Lead Model: 5076
Implantable Pulse Generator Implant Date: 20220318
Lead Channel Impedance Value: 342 Ohm
Lead Channel Impedance Value: 380 Ohm
Lead Channel Impedance Value: 494 Ohm
Lead Channel Impedance Value: 551 Ohm
Lead Channel Pacing Threshold Amplitude: 0.625 V
Lead Channel Pacing Threshold Amplitude: 0.75 V
Lead Channel Pacing Threshold Pulse Width: 0.4 ms
Lead Channel Pacing Threshold Pulse Width: 0.4 ms
Lead Channel Sensing Intrinsic Amplitude: 3.25 mV
Lead Channel Sensing Intrinsic Amplitude: 3.25 mV
Lead Channel Sensing Intrinsic Amplitude: 8.375 mV
Lead Channel Sensing Intrinsic Amplitude: 8.375 mV
Lead Channel Setting Pacing Amplitude: 1.5 V
Lead Channel Setting Pacing Amplitude: 2 V
Lead Channel Setting Pacing Pulse Width: 0.4 ms
Lead Channel Setting Sensing Sensitivity: 0.9 mV
Zone Setting Status: 755011

## 2024-02-28 NOTE — Progress Notes (Signed)
 Remote pacemaker transmission.

## 2024-02-28 NOTE — Addendum Note (Signed)
 Addended by: Edra Govern D on: 02/28/2024 05:02 PM   Modules accepted: Orders

## 2024-04-13 ENCOUNTER — Ambulatory Visit (INDEPENDENT_AMBULATORY_CARE_PROVIDER_SITE_OTHER): Payer: PRIVATE HEALTH INSURANCE

## 2024-04-13 DIAGNOSIS — I442 Atrioventricular block, complete: Secondary | ICD-10-CM

## 2024-04-13 LAB — CUP PACEART REMOTE DEVICE CHECK
Battery Remaining Longevity: 114 mo
Battery Voltage: 3.01 V
Brady Statistic AP VP Percent: 11.04 %
Brady Statistic AP VS Percent: 0 %
Brady Statistic AS VP Percent: 88.94 %
Brady Statistic AS VS Percent: 0.02 %
Brady Statistic RA Percent Paced: 11.02 %
Brady Statistic RV Percent Paced: 99.98 %
Date Time Interrogation Session: 20250615233323
Implantable Lead Connection Status: 753985
Implantable Lead Connection Status: 753985
Implantable Lead Implant Date: 20191018
Implantable Lead Implant Date: 20220318
Implantable Lead Location: 753859
Implantable Lead Location: 753860
Implantable Lead Model: 5076
Implantable Lead Model: 5076
Implantable Pulse Generator Implant Date: 20220318
Lead Channel Impedance Value: 342 Ohm
Lead Channel Impedance Value: 380 Ohm
Lead Channel Impedance Value: 475 Ohm
Lead Channel Impedance Value: 570 Ohm
Lead Channel Pacing Threshold Amplitude: 0.875 V
Lead Channel Pacing Threshold Amplitude: 0.875 V
Lead Channel Pacing Threshold Pulse Width: 0.4 ms
Lead Channel Pacing Threshold Pulse Width: 0.4 ms
Lead Channel Sensing Intrinsic Amplitude: 4 mV
Lead Channel Sensing Intrinsic Amplitude: 4 mV
Lead Channel Sensing Intrinsic Amplitude: 8.375 mV
Lead Channel Sensing Intrinsic Amplitude: 8.375 mV
Lead Channel Setting Pacing Amplitude: 1.75 V
Lead Channel Setting Pacing Amplitude: 2 V
Lead Channel Setting Pacing Pulse Width: 0.4 ms
Lead Channel Setting Sensing Sensitivity: 0.9 mV
Zone Setting Status: 755011

## 2024-04-16 ENCOUNTER — Ambulatory Visit: Payer: Self-pay | Admitting: Internal Medicine

## 2024-05-21 NOTE — Progress Notes (Signed)
 Remote pacemaker transmission.

## 2024-07-13 ENCOUNTER — Ambulatory Visit (INDEPENDENT_AMBULATORY_CARE_PROVIDER_SITE_OTHER): Payer: PRIVATE HEALTH INSURANCE

## 2024-07-13 DIAGNOSIS — I442 Atrioventricular block, complete: Secondary | ICD-10-CM | POA: Diagnosis not present

## 2024-07-14 LAB — CUP PACEART REMOTE DEVICE CHECK
Battery Remaining Longevity: 111 mo
Battery Voltage: 3.01 V
Brady Statistic AP VP Percent: 10.75 %
Brady Statistic AP VS Percent: 0 %
Brady Statistic AS VP Percent: 89.23 %
Brady Statistic AS VS Percent: 0.02 %
Brady Statistic RA Percent Paced: 10.74 %
Brady Statistic RV Percent Paced: 99.98 %
Date Time Interrogation Session: 20250914213707
Implantable Lead Connection Status: 753985
Implantable Lead Connection Status: 753985
Implantable Lead Implant Date: 20191018
Implantable Lead Implant Date: 20220318
Implantable Lead Location: 753859
Implantable Lead Location: 753860
Implantable Lead Model: 5076
Implantable Lead Model: 5076
Implantable Pulse Generator Implant Date: 20220318
Lead Channel Impedance Value: 361 Ohm
Lead Channel Impedance Value: 380 Ohm
Lead Channel Impedance Value: 494 Ohm
Lead Channel Impedance Value: 570 Ohm
Lead Channel Pacing Threshold Amplitude: 0.75 V
Lead Channel Pacing Threshold Amplitude: 0.875 V
Lead Channel Pacing Threshold Pulse Width: 0.4 ms
Lead Channel Pacing Threshold Pulse Width: 0.4 ms
Lead Channel Sensing Intrinsic Amplitude: 2.625 mV
Lead Channel Sensing Intrinsic Amplitude: 2.625 mV
Lead Channel Sensing Intrinsic Amplitude: 9.375 mV
Lead Channel Sensing Intrinsic Amplitude: 9.375 mV
Lead Channel Setting Pacing Amplitude: 1.5 V
Lead Channel Setting Pacing Amplitude: 2 V
Lead Channel Setting Pacing Pulse Width: 0.4 ms
Lead Channel Setting Sensing Sensitivity: 0.9 mV
Zone Setting Status: 755011

## 2024-07-17 ENCOUNTER — Ambulatory Visit: Payer: Self-pay | Admitting: Internal Medicine

## 2024-07-20 NOTE — Progress Notes (Signed)
 Remote PPM Transmission

## 2024-10-12 ENCOUNTER — Ambulatory Visit: Payer: PRIVATE HEALTH INSURANCE

## 2024-10-12 DIAGNOSIS — I442 Atrioventricular block, complete: Secondary | ICD-10-CM

## 2024-10-14 LAB — CUP PACEART REMOTE DEVICE CHECK
Battery Remaining Longevity: 107 mo
Battery Voltage: 3.01 V
Brady Statistic AP VP Percent: 7.62 %
Brady Statistic AP VS Percent: 0 %
Brady Statistic AS VP Percent: 92.35 %
Brady Statistic AS VS Percent: 0.03 %
Brady Statistic RA Percent Paced: 7.61 %
Brady Statistic RV Percent Paced: 99.97 %
Date Time Interrogation Session: 20251214225150
Implantable Lead Connection Status: 753985
Implantable Lead Connection Status: 753985
Implantable Lead Implant Date: 20191018
Implantable Lead Implant Date: 20220318
Implantable Lead Location: 753859
Implantable Lead Location: 753860
Implantable Lead Model: 5076
Implantable Lead Model: 5076
Implantable Pulse Generator Implant Date: 20220318
Lead Channel Impedance Value: 323 Ohm
Lead Channel Impedance Value: 361 Ohm
Lead Channel Impedance Value: 456 Ohm
Lead Channel Impedance Value: 532 Ohm
Lead Channel Pacing Threshold Amplitude: 0.625 V
Lead Channel Pacing Threshold Amplitude: 0.875 V
Lead Channel Pacing Threshold Pulse Width: 0.4 ms
Lead Channel Pacing Threshold Pulse Width: 0.4 ms
Lead Channel Sensing Intrinsic Amplitude: 2.375 mV
Lead Channel Sensing Intrinsic Amplitude: 2.375 mV
Lead Channel Sensing Intrinsic Amplitude: 9.375 mV
Lead Channel Sensing Intrinsic Amplitude: 9.375 mV
Lead Channel Setting Pacing Amplitude: 1.5 V
Lead Channel Setting Pacing Amplitude: 2 V
Lead Channel Setting Pacing Pulse Width: 0.4 ms
Lead Channel Setting Sensing Sensitivity: 0.9 mV
Zone Setting Status: 755011

## 2024-10-16 NOTE — Progress Notes (Signed)
 Remote PPM Transmission

## 2024-10-25 ENCOUNTER — Ambulatory Visit: Payer: Self-pay | Admitting: Internal Medicine
